# Patient Record
Sex: Male | Born: 1945 | Race: Black or African American | Hispanic: No | Smoking: Former smoker
Health system: Southern US, Community
[De-identification: ages and names within clinical notes are randomized; demographics above are authoritative.]

## PROBLEM LIST (undated history)

## (undated) DIAGNOSIS — D509 Iron deficiency anemia, unspecified: Secondary | ICD-10-CM

## (undated) DIAGNOSIS — N400 Enlarged prostate without lower urinary tract symptoms: Secondary | ICD-10-CM

## (undated) DIAGNOSIS — K219 Gastro-esophageal reflux disease without esophagitis: Secondary | ICD-10-CM

## (undated) DIAGNOSIS — I10 Essential (primary) hypertension: Secondary | ICD-10-CM

## (undated) HISTORY — DX: Gastro-esophageal reflux disease without esophagitis: K21.9

## (undated) HISTORY — PX: OTHER SURGICAL HISTORY: SHX169

## (undated) HISTORY — DX: Essential (primary) hypertension: I10

## (undated) HISTORY — DX: Iron deficiency anemia, unspecified: D50.9

## (undated) HISTORY — DX: Benign prostatic hyperplasia without lower urinary tract symptoms: N40.0

---

## 2012-11-05 ENCOUNTER — Ambulatory Visit (INDEPENDENT_AMBULATORY_CARE_PROVIDER_SITE_OTHER): Payer: Medicare HMO | Admitting: Nurse Practitioner

## 2012-11-05 ENCOUNTER — Encounter: Payer: Self-pay | Admitting: Nurse Practitioner

## 2012-11-05 VITALS — BP 190/100 | HR 89 | Temp 99.2°F | Resp 18 | Ht 67.52 in | Wt 220.4 lb

## 2012-11-05 DIAGNOSIS — R05 Cough: Secondary | ICD-10-CM

## 2012-11-05 DIAGNOSIS — R059 Cough, unspecified: Secondary | ICD-10-CM

## 2012-11-05 DIAGNOSIS — I10 Essential (primary) hypertension: Secondary | ICD-10-CM

## 2012-11-05 DIAGNOSIS — I160 Hypertensive urgency: Secondary | ICD-10-CM

## 2012-11-05 MED ORDER — NEBIVOLOL HCL 10 MG PO TABS: 10.0000 mg | ORAL_TABLET | Freq: Every day | ORAL | Status: DC

## 2012-11-05 MED ORDER — AMLODIPINE BESYLATE 10 MG PO TABS: 10.0000 mg | ORAL_TABLET | Freq: Every day | ORAL | Status: DC

## 2012-11-05 NOTE — Progress Notes (Signed)
Patient ID: Raymond Stephens, male   DOB: Jun 27, 1945, 67 y.o.   MRN: [id number].   No Known Allergies  Chief Complaint  Patient presents with  . Medical Managment of Chronic Issues    coughing and wheezing x6 mos, raspiness in voice ,    HPI: Patient is a 67 y.o. male seen in the office today to establish care.  Pt has not seen a medical provider in many years.  Reports he has a procedure on his prostate but is unsure what it was-- due to "urine not flowing like he was supposed to" Came to the office to be seen today due to cough, wheezing for 6 months.  Review of Systems:  Review of Systems  Constitutional: Negative for fever, chills, weight loss and malaise/fatigue.  HENT: Negative for ear pain, nosebleeds, congestion, sore throat, neck pain and tinnitus.   Eyes:       Wears reading glasses  Respiratory: Positive for cough, sputum production (thick, clear sputum at times) and wheezing. Negative for hemoptysis and shortness of breath.   Cardiovascular: Positive for palpitations. Negative for chest pain, claudication and leg swelling.  Gastrointestinal: Negative for heartburn, nausea, vomiting, abdominal pain, diarrhea and constipation.  Genitourinary: Negative for dysuria, urgency and frequency.  Musculoskeletal: Negative for myalgias and joint pain.  Neurological: Positive for dizziness (occasionally will be dizzy for areound 10 mins occurs abt 1 times a month). Negative for weakness and headaches.  Psychiatric/Behavioral: Negative for depression. The patient has insomnia (worked at night). The patient is not nervous/anxious.      History reviewed. No pertinent past medical history. History reviewed. No pertinent past surgical history. Social History:   reports that he quit smoking about 20 years ago. His smoking use included Cigarettes. He smoked 0.00 packs per day. He has quit using smokeless tobacco. His smokeless tobacco use included Chew. He reports that he does not drink alcohol  or use illicit drugs.  Family History  Problem Relation Age of Onset  . Heart disease Mother   . Cancer Father     Medications: Patient's Medications   No medications on file     Physical Exam:  Filed Vitals:   11/05/12 0922  BP: 190/100  Pulse: 89  Temp: 98.6 F (37.3 C)  Resp: 18  Height: 5' 7.52" (1.715 m)  Weight: 220 lb 6.4 oz (99.973 kg)  SpO2: 93%    Physical Exam  Constitutional: He is oriented to person, place, and time and well-developed, well-nourished, and in no distress. No distress.  HENT:  Head: Normocephalic and atraumatic.  Right Ear: External ear normal.  Eyes: Conjunctivae and EOM are normal. Pupils are equal, round, and reactive to light.  Neck: Normal range of motion. Neck supple. No JVD present. No thyromegaly present.  Cardiovascular: Normal rate, regular rhythm and normal heart sounds.   Pulmonary/Chest: Effort normal and breath sounds normal. No respiratory distress. He has no wheezes. He exhibits no tenderness.  Abdominal: Soft. Bowel sounds are normal.  Musculoskeletal: Normal range of motion. He exhibits no edema and no tenderness.  Lymphadenopathy:    He has no cervical adenopathy.  Neurological: He is alert and oriented to person, place, and time. No cranial nerve deficit. Gait normal.  Skin: Skin is warm and dry. He is not diaphoretic.  Psychiatric: Affect normal.    Assessment/Plan 1. Hypertensive urgency Pt given bystolic 10 mg in office; bps only improved slightly after 1 hr to 188/98- no clonidine available in office- pt asymptomatic; EKG done; will  have pt go directly to pharmacy to get amlodipine and take medication there. Will get blood work and follow up in 3 days   Discussed when to go to the ER pt agrees  - CBC With differential/Platelet - Comprehensive metabolic panel - amLODipine (NORVASC) 10 MG tablet; Take 1 tablet (10 mg total) by mouth daily.  Dispense: 90 tablet; Refill: 3  2. Cough May take mucinex DM 1 tablet q  12 hours for 1 week  Will get cxr   TIME PHYSICIAN WITH PATIENT: 60 mins Time TOTAL:  time greater than 50% of total time spent doing pt counseled and coordination of care regarding hypertension; giving medication reassessments

## 2012-11-05 NOTE — Patient Instructions (Addendum)
Take Bystolic daily Take Norvasc (this is at your pharmacy) daily  RTC on Monday for blood pressure check  May take mucinex DM with a full glass of water twice daily for cough and congestion    Hypertension As your heart beats, it forces blood through your arteries. This force is your blood pressure. If the pressure is too high, it is called hypertension (HTN) or high blood pressure. HTN is dangerous because you may have it and not know it. High blood pressure may mean that your heart has to work harder to pump blood. Your arteries may be narrow or stiff. The extra work puts you at risk for heart disease, stroke, and other problems.  Blood pressure consists of two numbers, a higher number over a lower, 110/72, for example. It is stated as "110 over 72." The ideal is below 120 for the top number (systolic) and under 80 for the bottom (diastolic). Write down your blood pressure today. You should pay close attention to your blood pressure if you have certain conditions such as:  Heart failure.  Prior heart attack.  Diabetes  Chronic kidney disease.  Prior stroke.  Multiple risk factors for heart disease. To see if you have HTN, your blood pressure should be measured while you are seated with your arm held at the level of the heart. It should be measured at least twice. A one-time elevated blood pressure reading (especially in the Emergency Department) does not mean that you need treatment. There may be conditions in which the blood pressure is different between your right and left arms. It is important to see your caregiver soon for a recheck. Most people have essential hypertension which means that there is not a specific cause. This type of high blood pressure may be lowered by changing lifestyle factors such as:  Stress.  Smoking.  Lack of exercise.  Excessive weight.  Drug/tobacco/alcohol use.  Eating less salt. Most people do not have symptoms from high blood pressure until it  has caused damage to the body. Effective treatment can often prevent, delay or reduce that damage. TREATMENT  When a cause has been identified, treatment for high blood pressure is directed at the cause. There are a large number of medications to treat HTN. These fall into several categories, and your caregiver will help you select the medicines that are best for you. Medications may have side effects. You should review side effects with your caregiver. If your blood pressure stays high after you have made lifestyle changes or started on medicines,   Your medication(s) may need to be changed.  Other problems may need to be addressed.  Be certain you understand your prescriptions, and know how and when to take your medicine.  Be sure to follow up with your caregiver within the time frame advised (usually within two weeks) to have your blood pressure rechecked and to review your medications.  If you are taking more than one medicine to lower your blood pressure, make sure you know how and at what times they should be taken. Taking two medicines at the same time can result in blood pressure that is too low. SEEK IMMEDIATE MEDICAL CARE IF:  You develop a severe headache, blurred or changing vision, or confusion.  You have unusual weakness or numbness, or a faint feeling.  You have severe chest or abdominal pain, vomiting, or breathing problems. MAKE SURE YOU:   Understand these instructions.  Will watch your condition.  Will get help right away if  you are not doing well or get worse. Document Released: 03/11/2005 Document Revised: 06/03/2011 Document Reviewed: 10/30/2007 Exit[employer] Patient Information 2014 [employer].  

## 2012-11-06 ENCOUNTER — Telehealth: Payer: Self-pay | Admitting: *Deleted

## 2012-11-06 ENCOUNTER — Other Ambulatory Visit: Payer: Self-pay | Admitting: Nurse Practitioner

## 2012-11-06 DIAGNOSIS — R05 Cough: Secondary | ICD-10-CM

## 2012-11-06 LAB — COMPREHENSIVE METABOLIC PANEL
Albumin: 4.3 g/dL (ref 3.6–4.8)
Alkaline Phosphatase: 144 IU/L — ABNORMAL HIGH (ref 39–117)
BUN: 8 mg/dL (ref 8–27)
CO2: 28 mmol/L (ref 18–29)
Calcium: 9.6 mg/dL (ref 8.6–10.2)
Chloride: 104 mmol/L (ref 97–108)
Creatinine, Ser: 1.01 mg/dL (ref 0.76–1.27)
Globulin, Total: 2.7 g/dL (ref 1.5–4.5)
Glucose: 90 mg/dL (ref 65–99)
Total Protein: 7 g/dL (ref 6.0–8.5)

## 2012-11-06 LAB — CBC WITH DIFFERENTIAL
Basophils Absolute: 0.1 10*3/uL (ref 0.0–0.2)
Eos: 6 % — ABNORMAL HIGH (ref 0–5)
HCT: 47.1 % (ref 37.5–51.0)
Immature Granulocytes: 0 % (ref 0–2)
Lymphs: 24 % (ref 14–46)
MCV: 75 fL — ABNORMAL LOW (ref 79–97)
Monocytes: 8 % (ref 4–12)
Platelets: 251 10*3/uL (ref 150–379)
RBC: 6.28 x10E6/uL — ABNORMAL HIGH (ref 4.14–5.80)
RDW: 17.6 % — ABNORMAL HIGH (ref 12.3–15.4)
WBC: 10.9 10*3/uL — ABNORMAL HIGH (ref 3.4–10.8)

## 2012-11-06 NOTE — Telephone Encounter (Signed)
Jessica sent labs to [facility] and she was out today. Shanda Bumps asked me to call the patient to go and get an chest x-ray at [facility] before his appointment on Monday due to his history of cough and high WBC's. Notified patient and he will go on Monday. Gave patient instructions and directions.

## 2012-11-09 ENCOUNTER — Ambulatory Visit (INDEPENDENT_AMBULATORY_CARE_PROVIDER_SITE_OTHER): Payer: 59 | Admitting: Nurse Practitioner

## 2012-11-09 ENCOUNTER — Encounter: Payer: Self-pay | Admitting: Nurse Practitioner

## 2012-11-09 ENCOUNTER — Ambulatory Visit
Admission: RE | Admit: 2012-11-09 | Discharge: 2012-11-09 | Disposition: A | Discharge status: Home / Self Care | Payer: Medicare HMO | Source: Ambulatory Visit | Attending: Nurse Practitioner | Admitting: Nurse Practitioner

## 2012-11-09 VITALS — BP 152/78 | HR 53 | Temp 98.9°F | Resp 18 | Ht 67.0 in | Wt 217.8 lb

## 2012-11-09 DIAGNOSIS — R05 Cough: Secondary | ICD-10-CM

## 2012-11-09 DIAGNOSIS — I10 Essential (primary) hypertension: Secondary | ICD-10-CM

## 2012-11-09 MED ORDER — HYDROCHLOROTHIAZIDE 25 MG PO TABS: 25.0000 mg | ORAL_TABLET | Freq: Every day | ORAL | Status: DC

## 2012-11-09 MED ORDER — LOSARTAN POTASSIUM 50 MG PO TABS: 50.0000 mg | ORAL_TABLET | Freq: Every day | ORAL | Status: DC

## 2012-11-09 NOTE — Progress Notes (Signed)
Patient ID: Raymond Stephens, male   DOB: 1945-11-08, 67 y.o.   MRN: [id number]   No Known Allergies  Chief Complaint  Patient presents with  . Follow-up    HPI: Patient is a 67 y.o. male seen in the office today for follow up blood pressure. Was seen last week with blood pressure in the 190s without coming down despite medicaton given in office (was given bystolic due to no clonidine available). Rx for norvasc added and he is back today for recheck. No chest pain, headache, changes in visions, no neurological deficits, or shortness of breath noted.  Mucinex DM has been helping cough and congestion.   Review of Systems:  Review of Systems  Constitutional: Negative for fever, chills and malaise/fatigue.  Eyes: Negative.   Cardiovascular: Negative for chest pain and palpitations.  Gastrointestinal: Negative for heartburn, diarrhea and constipation.  Neurological: Negative for dizziness, tingling, weakness and headaches.     No past medical history on file. No past surgical history on file. Social History:   reports that he quit smoking about 20 years ago. His smoking use included Cigarettes. He smoked 0.00 packs per day. He has quit using smokeless tobacco. His smokeless tobacco use included Chew. He reports that he does not drink alcohol or use illicit drugs.  Family History  Problem Relation Age of Onset  . Heart disease Mother   . Cancer Father     Medications: Patient's Medications  New Prescriptions   No medications on file  Previous Medications   AMLODIPINE (NORVASC) 10 MG TABLET    Take 1 tablet (10 mg total) by mouth daily.   NEBIVOLOL (BYSTOLIC) 10 MG TABLET    Take 1 tablet (10 mg total) by mouth daily.  Modified Medications   No medications on file  Discontinued Medications   No medications on file     Physical Exam:  Filed Vitals:   11/09/12 1420  BP: 152/78  Pulse: 53  Temp: 98.9 F (37.2 C)  TempSrc: Oral  Resp: 18  Height: 5\' 7"  (1.702 m)  Weight:  217 lb 12.8 oz (98.793 kg)  SpO2: 95%   Physical Exam  Constitutional: He is oriented to person, place, and time. No distress.  HENT:  Head: Normocephalic and atraumatic.  Eyes: Conjunctivae and EOM are normal. Pupils are equal, round, and reactive to light.  Neck: Normal range of motion. Neck supple. No thyromegaly present.  Cardiovascular: Normal rate, regular rhythm and normal heart sounds.   Pulmonary/Chest: Effort normal and breath sounds normal. No respiratory distress. He has no wheezes.  Abdominal: Soft. Bowel sounds are normal. He exhibits no distension.  Neurological: He is alert and oriented to person, place, and time. No cranial nerve deficit. Gait normal.  Skin: Skin is warm and dry. He is not diaphoretic.  Psychiatric: Affect normal.     Labs reviewed: Basic Metabolic Panel:  Recent Labs  13/08/65 1211  NA 145*  K 4.6  CL 104  CO2 28  GLUCOSE 90  BUN 8  CREATININE 1.01  CALCIUM 9.6   Liver Function Tests:  Recent Labs  11/05/12 1211  AST 21  ALT 23  ALKPHOS 144*  BILITOT 0.2  PROT 7.0   No results found for this basename: LIPASE, AMYLASE,  in the last 8760 hours No results found for this basename: AMMONIA,  in the last 8760 hours CBC:  Recent Labs  11/05/12 1211  WBC 10.9*  NEUTROABS 6.8  HGB 14.3  HCT 47.1  MCV 75*  PLT 251    Assessment/Plan 1. Essential hypertension, benign Improved- will stop bystolic due to low HR- labs back and reviewed with pt; will change  Medication to losartan and HCTZ - losartan (COZAAR) 50 MG tablet; Take 1 tablet (50 mg total) by mouth daily.  Dispense: 30 tablet; Refill: 1 - hydrochlorothiazide (HYDRODIURIL) 25 MG tablet; Take 1 tablet (25 mg total) by mouth daily.  Dispense: 30 tablet; Refill: 1 Will get  Lipid panel and  Comprehensive metabolic panel before next visit  To follow up in 1 month for EV w

## 2012-11-09 NOTE — Patient Instructions (Addendum)
To follow up in 1 month for an EV with [facility]---will get fasting blood work before visit.   Cardiac Diet This diet can help prevent heart disease and stroke. Many factors influence your heart health, including eating and exercise habits. Coronary risk rises a lot with abnormal blood fat (lipid) levels. Cardiac meal planning includes limiting unhealthy fats, increasing healthy fats, and making other small dietary changes. General guidelines are as follows:  Adjust calorie intake to reach and maintain desirable body weight.  Limit total fat intake to less than 30% of total calories. Saturated fat should be less than 7% of calories.  Saturated fats are found in animal products and in some vegetable products. Saturated vegetable fats are found in coconut oil, cocoa butter, palm oil, and palm kernel oil. Read labels carefully to avoid these products as much as possible. Use butter in moderation. Choose tub margarines and oils that have 2 grams of fat or less. Good cooking oils are canola and olive oils.  Practice low-fat cooking techniques. Do not fry food. Instead, broil, bake, boil, steam, grill, roast on a rack, stir-fry, or microwave it. Other fat reducing suggestions include:  Remove the skin from poultry.  Remove all visible fat from meats.  Skim the fat off stews, soups, and gravies before serving them.  Steam vegetables in water or broth instead of sauting them in fat.  Avoid foods with trans fat (or hydrogenated oils), such as commercially fried foods and commercially baked goods. Commercial shortening and deep-frying fats will contain trans fat.  Increase intake of fruits, vegetables, whole grains, and legumes to replace foods high in fat.  Increase consumption of nuts, legumes, and seeds to at least 4 servings weekly. One serving of a legume equals  cup, and 1 serving of nuts or seeds equals  cup.  Choose whole grains more often. Have 3 servings per day (a serving is 1 ounce  [oz]).  Eat 4 to 5 servings of vegetables per day. A serving of vegetables is 1 cup of raw leafy vegetables;  cup of raw or cooked cut-up vegetables;  cup of vegetable juice.  Eat 4 to 5 servings of fruit per day. A serving of fruit is 1 medium whole fruit;  cup of dried fruit;  cup of fresh, frozen, or canned fruit;  cup of 100% fruit juice.  Increase your intake of dietary fiber to 20 to 30 grams per day. Insoluble fiber may help lower your risk of heart disease and may help curb your appetite.  Soluble fiber binds cholesterol to be removed from the blood. Foods high in soluble fiber are dried beans, citrus fruits, oats, apples, bananas, broccoli, Brussels sprouts, and eggplant.  Try to include foods fortified with plant sterols or stanols, such as yogurt, breads, juices, or margarines. Choose several fortified foods to achieve a daily intake of 2 to 3 grams of plant sterols or stanols.  Foods with omega-3 fats can help reduce your risk of heart disease. Aim to have a 3.5 oz portion of fatty fish twice per week, such as salmon, mackerel, albacore tuna, sardines, lake trout, or herring. If you wish to take a fish oil supplement, choose one that contains 1 gram of both DHA and EPA.  Limit processed meats to 2 servings (3 oz portion) weekly.  Limit the sodium in your diet to 1500 milligrams (mg) per day. If you have high blood pressure, talk to a registered dietitian about a DASH (Dietary Approaches to Stop Hypertension) eating plan.  Limit sweets and beverages with added sugar, such as soda, to no more than 5 servings per week. One serving is:   1 tablespoon sugar.  1 tablespoon jelly or jam.   cup sorbet.  1 cup lemonade.   cup regular soda. CHOOSING FOODS Starches  Allowed: Breads: All kinds (wheat, rye, raisin, white, oatmeal, [place], [place], and English muffin bread). Low-fat rolls: English muffins, frankfurter and hamburger buns, bagels, pita bread, tortillas (not fried).  Pancakes, waffles, biscuits, and muffins made with recommended oil.  Avoid: Products made with saturated or trans fats, oils, or whole milk products. Butter rolls, cheese breads, croissants. Commercial doughnuts, muffins, sweet rolls, biscuits, waffles, pancakes, store-bought mixes. Crackers  Allowed: Low-fat crackers and snacks: Animal, graham, rye, saltine (with recommended oil, no lard), oyster, and matzo crackers. Bread sticks, melba toast, rusks, flatbread, pretzels, and light popcorn.  Avoid: High-fat crackers: cheese crackers, butter crackers, and those made with coconut, palm oil, or trans fat (hydrogenated oils). Buttered popcorn. Cereals  Allowed: Hot or cold whole-grain cereals.  Avoid: Cereals containing coconut, hydrogenated vegetable fat, or animal fat. Potatoes / Pasta / Rice  Allowed: All kinds of potatoes, rice, and pasta (such as macaroni, spaghetti, and noodles).  Avoid: Pasta or rice prepared with cream sauce or high-fat cheese. Chow mein noodles, [place] fries. Vegetables  Allowed: All vegetables and vegetable juices.  Avoid: Fried vegetables. Vegetables in cream, butter, or high-fat cheese sauces. Limit coconut. Fruit in cream or custard. Protein  Allowed: Limit your intake of meat, seafood, and poultry to no more than 6 oz (cooked weight) per day. All lean, well-trimmed beef, veal, pork, and lamb. All chicken and [place] without skin. All fish and shellfish. Wild game: wild duck, rabbit, pheasant, and venison. Egg whites or low-cholesterol egg substitutes may be used as desired. Meatless dishes: recipes with dried beans, peas, lentils, and tofu (soybean curd). Seeds and nuts: all seeds and most nuts.  Avoid: Prime grade and other heavily marbled and fatty meats, such as short ribs, spare ribs, rib eye roast or steak, frankfurters, sausage, bacon, and high-fat luncheon meats, mutton. Caviar. Commercially fried fish. Domestic duck, goose, venison sausage. Organ meats:  liver, gizzard, heart, chitterlings, brains, kidney, sweetbreads. Dairy  Allowed: Low-fat cheeses: nonfat or low-fat cottage cheese (1% or 2% fat), cheeses made with part skim milk, such as mozzarella, farmers, string, or ricotta. (Cheeses should be labeled no more than 2 to 6 grams fat per oz.). Skim (or 1%) milk: liquid, powdered, or evaporated. Buttermilk made with low-fat milk. Drinks made with skim or low-fat milk or cocoa. Chocolate milk or cocoa made with skim or low-fat (1%) milk. Nonfat or low-fat yogurt.  Avoid: Whole milk cheeses, including colby, cheddar, mu[place]ster, Monterey Jack, Dawson, Zena, Semmes, American, Swiss, and blue. Creamed cottage cheese, cream cheese. Whole milk and whole milk products, including buttermilk or yogurt made from whole milk, drinks made from whole milk. Condensed milk, evaporated whole milk, and 2% milk. Soups and Combination Foods  Allowed: Low-fat low-sodium soups: broth, dehydrated soups, homemade broth, soups with the fat removed, homemade cream soups made with skim or low-fat milk. Low-fat spaghetti, lasagna, chili, and Spanish rice if low-fat ingredients and low-fat cooking techniques are used.  Avoid: Cream soups made with whole milk, cream, or high-fat cheese. All other soups. Desserts and Sweets  Allowed: Sherbet, fruit ices, gelatins, meringues, and angel food cake. Homemade desserts with recommended fats, oils, and milk products. Jam, jelly, honey, marmalade, sugars, and syrups. Pure sugar candy, such  as gum drops, hard candy, jelly beans, marshmallows, mints, and small amounts of dark chocolate.  Avoid: Commercially prepared cakes, pies, cookies, frosting, pudding, or mixes for these products. Desserts containing whole milk products, chocolate, coconut, lard, palm oil, or palm kernel oil. Ice cream or ice cream drinks. Candy that contains chocolate, coconut, butter, hydrogenated fat, or unknown ingredients. Buttered syrups. Fats and  Oils  Allowed: Vegetable oils: safflower, sunflower, corn, soybean, cottonseed, sesame, canola, olive, or peanut. Non-hydrogenated margarines. Salad dressing or mayonnaise: homemade or commercial, made with a recommended oil. Low or nonfat salad dressing or mayonnaise.  Limit added fats and oils to 6 to 8 tsp per day (includes fats used in cooking, baking, salads, and spreads on bread). Remember to count the "hidden fats" in foods.  Avoid: Solid fats and shortenings: butter, lard, salt pork, bacon drippings. Gravy containing meat fat, shortening, or suet. Cocoa butter, coconut. Coconut oil, palm oil, palm kernel oil, or hydrogenated oils: these ingredients are often used in bakery products, nondairy creamers, whipped toppings, candy, and commercially fried foods. Read labels carefully. Salad dressings made of unknown oils, sour cream, or cheese, such as blue cheese and Roquefort. Cream, all kinds: half-and-half, light, heavy, or whipping. Sour cream or cream cheese (even if "light" or low-fat). Nondairy cream substitutes: coffee creamers and sour cream substitutes made with palm, palm kernel, hydrogenated oils, or coconut oil. Beverages  Allowed: Coffee (regular or decaffeinated), tea. Diet carbonated beverages, mineral water. Alcohol: Check with your caregiver. Moderation is recommended.  Avoid: Whole milk, regular sodas, and juice drinks with added sugar. Condiments  Allowed: All seasonings and condiments. Cocoa powder. "Cream" sauces made with recommended ingredients.  Avoid: Carob powder made with hydrogenated fats. SAMPLE MENU Breakfast   cup orange juice   cup oatmeal  1 slice toast  1 tsp margarine  1 cup skim milk Lunch  [place] sandwich with 2 oz [place], 2 slices bread  Lettuce and tomato slices  Fresh fruit  Carrot sticks  Coffee or tea Snack  Fresh fruit or low-fat crackers Dinner  3 oz lean ground beef  1 baked potato  1 tsp margarine   cup  asparagus  Lettuce salad  1 tbs non-creamy dressing   cup peach slices  1 cup skim milk Document Released: 12/19/2007 Document Revised: 09/10/2011 Document Reviewed: 06/04/2011 Exit[employer] Patient Information 2014 [place], [place].

## 2012-11-26 ENCOUNTER — Encounter: Payer: Self-pay | Admitting: Nurse Practitioner

## 2012-12-15 ENCOUNTER — Other Ambulatory Visit: Payer: Medicare HMO

## 2012-12-15 DIAGNOSIS — I10 Essential (primary) hypertension: Secondary | ICD-10-CM

## 2012-12-16 LAB — COMPREHENSIVE METABOLIC PANEL
ALT: 27 IU/L (ref 0–44)
CO2: 28 mmol/L (ref 18–29)
Calcium: 9.6 mg/dL (ref 8.6–10.2)
Chloride: 102 mmol/L (ref 97–108)
GFR calc non Af Amer: 66 mL/min/{1.73_m2} (ref >59)
Glucose: 90 mg/dL (ref 65–99)
Potassium: 4 mmol/L (ref 3.5–5.2)
Total Protein: 6.7 g/dL (ref 6.0–8.5)

## 2012-12-16 LAB — LIPID PANEL
Chol/HDL Ratio: 5.3 ratio units — ABNORMAL HIGH (ref 0.0–5.0)
HDL: 32 mg/dL — ABNORMAL LOW (ref >39)

## 2012-12-17 ENCOUNTER — Encounter: Payer: Self-pay | Admitting: Nurse Practitioner

## 2012-12-17 ENCOUNTER — Ambulatory Visit (INDEPENDENT_AMBULATORY_CARE_PROVIDER_SITE_OTHER): Payer: Medicare HMO | Admitting: Nurse Practitioner

## 2012-12-17 VITALS — BP 150/88 | HR 84 | Temp 98.3°F | Resp 18 | Ht 67.0 in | Wt 213.8 lb

## 2012-12-17 DIAGNOSIS — R05 Cough: Secondary | ICD-10-CM

## 2012-12-17 DIAGNOSIS — I10 Essential (primary) hypertension: Secondary | ICD-10-CM

## 2012-12-17 DIAGNOSIS — N400 Enlarged prostate without lower urinary tract symptoms: Secondary | ICD-10-CM

## 2012-12-17 DIAGNOSIS — Z23 Encounter for immunization: Secondary | ICD-10-CM

## 2012-12-17 DIAGNOSIS — R059 Cough, unspecified: Secondary | ICD-10-CM

## 2012-12-17 DIAGNOSIS — Z139 Encounter for screening, unspecified: Secondary | ICD-10-CM

## 2012-12-17 DIAGNOSIS — E785 Hyperlipidemia, unspecified: Secondary | ICD-10-CM

## 2012-12-17 DIAGNOSIS — I160 Hypertensive urgency: Secondary | ICD-10-CM

## 2012-12-17 MED ORDER — HYDROCHLOROTHIAZIDE 25 MG PO TABS: 25.0000 mg | ORAL_TABLET | Freq: Every day | ORAL | Status: DC

## 2012-12-17 MED ORDER — OMEPRAZOLE 20 MG PO CPDR: 20.0000 mg | DELAYED_RELEASE_CAPSULE | Freq: Every day | ORAL | Status: DC

## 2012-12-17 MED ORDER — LOSARTAN POTASSIUM 50 MG PO TABS: 50.0000 mg | ORAL_TABLET | Freq: Every day | ORAL | Status: DC

## 2012-12-17 MED ORDER — AMLODIPINE BESYLATE 10 MG PO TABS: 10.0000 mg | ORAL_TABLET | Freq: Every day | ORAL | Status: DC

## 2012-12-17 NOTE — Patient Instructions (Addendum)
To cont amlodipine and losartan; STOP hydrochlorothiazide-- we will see if this is causing fatigue  Start omeprazole daily to see if this helps with cough  To FOLLOW Up in 2 week to evaluate bp and cough  Will send you to the urologist to establish care due to BPH Will get colonoscopy referral    Will get screening US for abdominal aortic aneurysm   Need to go to the eye doctor: [facility]  Address: [address] [place], [place], [place] [address]  [phone]   Cholesterol is elevated; will see if you can bring it down with diet and exercise  30 mins of cardiovascular activity 5 days a week (do what you can tolerate) Eat a Heart healthy low sodium   Cardiac Diet This diet can help prevent heart disease and stroke. Many factors influence your heart health, including eating and exercise habits. Coronary risk rises a lot with abnormal blood fat (lipid) levels. Cardiac meal planning includes limiting unhealthy fats, increasing healthy fats, and making other small dietary changes. General guidelines are as follows:  Adjust calorie intake to reach and maintain desirable body weight.  Limit total fat intake to less than 30% of total calories. Saturated fat should be less than 7% of calories.  Saturated fats are found in animal products and in some vegetable products. Saturated vegetable fats are found in coconut oil, cocoa butter, palm oil, and palm kernel oil. Read labels carefully to avoid these products as much as possible. Use butter in moderation. Choose tub margarines and oils that have 2 grams of fat or less. Good cooking oils are canola and olive oils.  Practice low-fat cooking techniques. Do not fry food. Instead, broil, bake, boil, steam, grill, roast on a rack, stir-fry, or microwave it. Other fat reducing suggestions include:  Remove the skin from poultry.  Remove all visible fat from meats.  Skim the fat off stews, soups, and gravies before serving them.  Steam  vegetables in water or broth instead of sauting them in fat.  Avoid foods with trans fat (or hydrogenated oils), such as commercially fried foods and commercially baked goods. Commercial shortening and deep-frying fats will contain trans fat.  Increase intake of fruits, vegetables, whole grains, and legumes to replace foods high in fat.  Increase consumption of nuts, legumes, and seeds to at least 4 servings weekly. One serving of a legume equals  cup, and 1 serving of nuts or seeds equals  cup.  Choose whole grains more often. Have 3 servings per day (a serving is 1 ounce [oz]).  Eat 4 to 5 servings of vegetables per day. A serving of vegetables is 1 cup of raw leafy vegetables;  cup of raw or cooked cut-up vegetables;  cup of vegetable juice.  Eat 4 to 5 servings of fruit per day. A serving of fruit is 1 medium whole fruit;  cup of dried fruit;  cup of fresh, frozen, or canned fruit;  cup of 100% fruit juice.  Increase your intake of dietary fiber to 20 to 30 grams per day. Insoluble fiber may help lower your risk of heart disease and may help curb your appetite.  Soluble fiber binds cholesterol to be removed from the blood. Foods high in soluble fiber are dried beans, citrus fruits, oats, apples, bananas, broccoli, Brussels sprouts, and eggplant.  Try to include foods fortified with plant sterols or stanols, such as yogurt, breads, juices, or margarines. Choose several fortified foods to achieve a daily intake of 2  to 3 grams of plant sterols or stanols.  Foods with omega-3 fats can help reduce your risk of heart disease. Aim to have a 3.5 oz portion of fatty fish twice per week, such as salmon, mackerel, albacore tuna, sardines, lake trout, or herring. If you wish to take a fish oil supplement, choose one that contains 1 gram of both DHA and EPA.  Limit processed meats to 2 servings (3 oz portion) weekly.  Limit the sodium in your diet to 1500 milligrams (mg) per day. If you have  high blood pressure, talk to a registered dietitian about a DASH (Dietary Approaches to Stop Hypertension) eating plan.  Limit sweets and beverages with added sugar, such as soda, to no more than 5 servings per week. One serving is:   1 tablespoon sugar.  1 tablespoon jelly or jam.   cup sorbet.  1 cup lemonade.   cup regular soda. CHOOSING FOODS Starches  Allowed: Breads: All kinds (wheat, rye, raisin, white, oatmeal, [place], [place], and English muffin bread). Low-fat rolls: English muffins, frankfurter and hamburger buns, bagels, pita bread, tortillas (not fried). Pancakes, waffles, biscuits, and muffins made with recommended oil.  Avoid: Products made with saturated or trans fats, oils, or whole milk products. Butter rolls, cheese breads, croissants. Commercial doughnuts, muffins, sweet rolls, biscuits, waffles, pancakes, store-bought mixes. Crackers  Allowed: Low-fat crackers and snacks: Animal, graham, rye, saltine (with recommended oil, no lard), oyster, and matzo crackers. Bread sticks, melba toast, rusks, flatbread, pretzels, and light popcorn.  Avoid: High-fat crackers: cheese crackers, butter crackers, and those made with coconut, palm oil, or trans fat (hydrogenated oils). Buttered popcorn. Cereals  Allowed: Hot or cold whole-grain cereals.  Avoid: Cereals containing coconut, hydrogenated vegetable fat, or animal fat. Potatoes / Pasta / Rice  Allowed: All kinds of potatoes, rice, and pasta (such as macaroni, spaghetti, and noodles).  Avoid: Pasta or rice prepared with cream sauce or high-fat cheese. Chow mein noodles, [place] fries. Vegetables  Allowed: All vegetables and vegetable juices.  Avoid: Fried vegetables. Vegetables in cream, butter, or high-fat cheese sauces. Limit coconut. Fruit in cream or custard. Protein  Allowed: Limit your intake of meat, seafood, and poultry to no more than 6 oz (cooked weight) per day. All lean, well-trimmed beef, veal, pork,  and lamb. All chicken and [place] without skin. All fish and shellfish. Wild game: wild duck, rabbit, pheasant, and venison. Egg whites or low-cholesterol egg substitutes may be used as desired. Meatless dishes: recipes with dried beans, peas, lentils, and tofu (soybean curd). Seeds and nuts: all seeds and most nuts.  Avoid: Prime grade and other heavily marbled and fatty meats, such as short ribs, spare ribs, rib eye roast or steak, frankfurters, sausage, bacon, and high-fat luncheon meats, mutton. Caviar. Commercially fried fish. Domestic duck, goose, venison sausage. Organ meats: liver, gizzard, heart, chitterlings, brains, kidney, sweetbreads. Dairy  Allowed: Low-fat cheeses: nonfat or low-fat cottage cheese (1% or 2% fat), cheeses made with part skim milk, such as mozzarella, farmers, string, or ricotta. (Cheeses should be labeled no more than 2 to 6 grams fat per oz.). Skim (or 1%) milk: liquid, powdered, or evaporated. Buttermilk made with low-fat milk. Drinks made with skim or low-fat milk or cocoa. Chocolate milk or cocoa made with skim or low-fat (1%) milk. Nonfat or low-fat yogurt.  Avoid: Whole milk cheeses, including colby, cheddar, [place], [address], [place], [place], [place], [address], [place], and blue. Creamed cottage cheese, cream cheese. Whole milk and whole milk products, including buttermilk or  yogurt made from whole milk, drinks made from whole milk. Condensed milk, evaporated whole milk, and 2% milk. Soups and Combination Foods  Allowed: Low-fat low-sodium soups: broth, dehydrated soups, homemade broth, soups with the fat removed, homemade cream soups made with skim or low-fat milk. Low-fat spaghetti, lasagna, chili, and Spanish rice if low-fat ingredients and low-fat cooking techniques are used.  Avoid: Cream soups made with whole milk, cream, or high-fat cheese. All other soups. Desserts and Sweets  Allowed: Sherbet, fruit ices, gelatins, meringues, and angel food cake.  Homemade desserts with recommended fats, oils, and milk products. Jam, jelly, honey, marmalade, sugars, and syrups. Pure sugar candy, such as gum drops, hard candy, jelly beans, marshmallows, mints, and small amounts of dark chocolate.  Avoid: Commercially prepared cakes, pies, cookies, frosting, pudding, or mixes for these products. Desserts containing whole milk products, chocolate, coconut, lard, palm oil, or palm kernel oil. Ice cream or ice cream drinks. Candy that contains chocolate, coconut, butter, hydrogenated fat, or unknown ingredients. Buttered syrups. Fats and Oils  Allowed: Vegetable oils: safflower, sunflower, corn, soybean, cottonseed, sesame, canola, olive, or peanut. Non-hydrogenated margarines. Salad dressing or mayonnaise: homemade or commercial, made with a recommended oil. Low or nonfat salad dressing or mayonnaise.  Limit added fats and oils to 6 to 8 tsp per day (includes fats used in cooking, baking, salads, and spreads on bread). Remember to count the "hidden fats" in foods.  Avoid: Solid fats and shortenings: butter, lard, salt pork, bacon drippings. Gravy containing meat fat, shortening, or suet. Cocoa butter, coconut. Coconut oil, palm oil, palm kernel oil, or hydrogenated oils: these ingredients are often used in bakery products, nondairy creamers, whipped toppings, candy, and commercially fried foods. Read labels carefully. Salad dressings made of unknown oils, sour cream, or cheese, such as blue cheese and Roquef[place]. Cream, all kinds: half-and-half, light, heavy, or whipping. Sour cream or cream cheese (even if "light" or low-fat). Nondairy cream substitutes: coffee creamers and sour cream substitutes made with palm, palm kernel, hydrogenated oils, or coconut oil. Beverages  Allowed: Coffee (regular or decaffeinated), tea. Diet carbonated beverages, mineral water. Alcohol: Check with your caregiver. Moderation is recommended.  Avoid: Whole milk, regular sodas, and juice  drinks with added sugar. Condiments  Allowed: All seasonings and condiments. Cocoa powder. "Cream" sauces made with recommended ingredients.  Avoid: Carob powder made with hydrogenated fats. SAMPLE MENU Breakfast   cup orange juice   cup oatmeal  1 slice toast  1 tsp margarine  1 cup skim milk Lunch  [place] sandwich with 2 oz [place], 2 slices bread  Lettuce and tomato slices  Fresh fruit  Carrot sticks  Coffee or tea Snack  Fresh fruit or low-fat crackers Dinner  3 oz lean ground beef  1 baked potato  1 tsp margarine   cup asparagus  Lettuce salad  1 tbs non-creamy dressing   cup peach slices  1 cup skim milk Document Released: 12/19/2007 Document Revised: 09/10/2011 Document Reviewed: 06/04/2011 [employer] Patient Information 2014 [place], [place].

## 2012-12-17 NOTE — Progress Notes (Signed)
Patient ID: Raymond Stephens, male   DOB: 07/05/1945, 67 y.o.   MRN: [id number]   No Known Allergies  Chief Complaint  Patient presents with  . Annual Exam    medication refills    HPI: Patient is a 67 y.o. male seen in the office today for extended visit. Pt established care last month; pt previously has not had any routine medical care; pt had hypertension that was followed up on and improved with medication; however he has not refilled losartan and HCTZ and has not refilled this. Took norvasc today. Reports medication has made him feel very sluggish; has been better last 3 days since he ran out of losartan and hctz  Pt reports history of BPH is not currently on any medication for this but has frequent voiding episodes; was seen by urologist in [place] before he moved and would like to establish care with urologist here in [place] for ongoing evaluation.   Has ongoing cough; hoariness and clearing throat as been taking mucinex with good effect.  No fevers or chills, denies shortness of breath Review of Systems:  Review of Systems  Constitutional: Negative for fever, chills, weight loss and malaise/fatigue.  HENT: Negative for hearing loss, ear pain, nosebleeds, congestion, sore throat and tinnitus.   Eyes:       Has not been to the eye doctor in over 2 years; no noted changes in vision but wears glasses   Respiratory: Positive for cough. Negative for sputum production, shortness of breath and wheezing.   Cardiovascular: Negative for chest pain, palpitations and leg swelling.  Gastrointestinal: Negative for heartburn, abdominal pain, diarrhea, constipation, blood in stool and melena.  Genitourinary: Positive for frequency. Negative for dysuria and urgency.  Musculoskeletal: Negative for myalgias, joint pain and falls.  Skin: Negative.   Neurological: Negative for dizziness, tingling, sensory change, weakness and headaches.  Psychiatric/Behavioral: Negative for depression. The patient is not  nervous/anxious and does not have insomnia.      Past Medical History  Diagnosis Date  . Hypertension    History reviewed. No pertinent past surgical history. Social History:   reports that he quit smoking about 20 years ago. His smoking use included Cigarettes. He smoked 0.00 packs per day. He has quit using smokeless tobacco. His smokeless tobacco use included Chew. He reports that he does not drink alcohol or use illicit drugs.  Family History  Problem Relation Age of Onset  . Heart disease Mother   . Cancer Father     Medications: Patient's Medications  New Prescriptions   OMEPRAZOLE (PRILOSEC) 20 MG CAPSULE    Take 1 capsule (20 mg total) by mouth daily.  Previous Medications   DEXTROMETHORPHAN-GUAIFENESIN (MUCINEX DM) 30-600 MG PER 12 HR TABLET    Take 1 tablet by mouth every 12 (twelve) hours.  Modified Medications   Modified Medication Previous Medication   AMLODIPINE (NORVASC) 10 MG TABLET amLODipine (NORVASC) 10 MG tablet      Take 1 tablet (10 mg total) by mouth daily.    Take 1 tablet (10 mg total) by mouth daily.   HYDROCHLOROTHIAZIDE (HYDRODIURIL) 25 MG TABLET hydrochlorothiazide (HYDRODIURIL) 25 MG tablet      Take 1 tablet (25 mg total) by mouth daily.    Take 1 tablet (25 mg total) by mouth daily.   LOSARTAN (COZAAR) 50 MG TABLET losartan (COZAAR) 50 MG tablet      Take 1 tablet (50 mg total) by mouth daily.    Take 1 tablet (50 mg total)  by mouth daily.  Discontinued Medications   No medications on file     Physical Exam:  Filed Vitals:   12/17/12 1133  BP: 150/88  Pulse: 84  Temp: 98.3 F (36.8 C)  TempSrc: Oral  Resp: 18  Height: 5\' 7"  (1.702 m)  Weight: 213 lb 12.8 oz (96.979 kg)  SpO2: 93%   Physical Exam  Constitutional: He is oriented to person, place, and time and well-developed, well-nourished, and in no distress. No distress.  HENT:  Head: Normocephalic and atraumatic.  Right Ear: External ear normal.  Left Ear: External ear normal.   Nose: Nose normal.  Mouth/Throat: Oropharynx is clear and moist. No oropharyngeal exudate.  Eyes: Conjunctivae and EOM are normal. Pupils are equal, round, and reactive to light.  Neck: Normal range of motion. Neck supple. No thyromegaly present.  Cardiovascular: Normal rate, regular rhythm and normal heart sounds.   Pulmonary/Chest: Effort normal and breath sounds normal. No respiratory distress. He has no wheezes.  Abdominal: Soft. Bowel sounds are normal. He exhibits no distension. There is no tenderness.  Genitourinary:  Declines rectal exam but would like to see urologist regarding prostate  Musculoskeletal: Normal range of motion. He exhibits no edema and no tenderness.  Lymphadenopathy:    He has no cervical adenopathy.  Neurological: He is alert and oriented to person, place, and time. Gait normal.  Skin: Skin is warm and dry. He is not diaphoretic. No erythema.  Psychiatric: Affect normal.     Labs reviewed: Basic Metabolic Panel:  Recent Labs  16/10/96 1211 12/15/12 0831  NA 145* 144  K 4.6 4.0  CL 104 102  CO2 28 28  GLUCOSE 90 90  BUN 8 12  CREATININE 1.01 1.14  CALCIUM 9.6 9.6   Liver Function Tests:  Recent Labs  11/05/12 1211 12/15/12 0831  AST 21 19  ALT 23 27  ALKPHOS 144* 137*  BILITOT 0.2 0.3  PROT 7.0 6.7   No results found for this basename: LIPASE, AMYLASE,  in the last 8760 hours No results found for this basename: AMMONIA,  in the last 8760 hours CBC:  Recent Labs  11/05/12 1211  WBC 10.9*  NEUTROABS 6.8  HGB 14.3  HCT 47.1  MCV 75*  PLT 251   Lipid Panel:  Recent Labs  12/15/12 0831  HDL 32*  LDLCALC 106*  TRIG 160*  CHOLHDL 5.3*      Assessment/Plan    Essential hypertension, benign Will stop HCTZ at this time and cont cozaar and norvasc - losartan (COZAAR) 50 MG tablet; Take 1 tablet (50 mg total) by mouth daily.  Dispense: 30 tablet; Refill: 3 - amLODipine (NORVASC) 10 MG tablet; Take 1 tablet (10 mg total)  by mouth daily.  Dispense: 90 tablet; Refill: 3  Cough Will rule out cause being due to GERD  - omeprazole (PRILOSEC) 20 MG capsule; Take 1 capsule (20 mg total) by mouth daily.  Dispense: 30 capsule; Refill: 3  Screening - Ambulatory referral to Gastroenterology - [place] Screening AAA; Future - recs to see eye doctor  BPH (benign prostatic hyperplasia) - Ambulatory referral to Urology   Need for prophylactic vaccination and inoculation against influenza Flu vaccine given   Need for prophylactic vaccination against Streptococcus pneumoniae (pneumococcus) - Pneumococcal polysaccharide vaccine 23-valent greater than or equal to 2yo subcutaneous/IM  Hyperlipidemia - pt willing to try lifestyle modifications at this time; given information on diet and exercise to help lower cholesterol; will follow up cholesterol and if pt  unable to get to goal will need to start medication. Pt aware   PREVENTIVE COUNSELING:  The patient was counseled regarding the appropriate use of alcohol,  diet, regular sustained exercise for at least 30 minutes 3-4 times per week, testicular self-examination on a monthly basis,and recommended schedule for GI hemoccult testing, colonoscopy, cholesterol, and diabetes screening.

## 2012-12-18 ENCOUNTER — Encounter: Payer: Self-pay | Admitting: Internal Medicine

## 2012-12-23 ENCOUNTER — Ambulatory Visit
Admission: RE | Admit: 2012-12-23 | Discharge: 2012-12-23 | Disposition: A | Discharge status: Home / Self Care | Payer: Medicare HMO | Source: Ambulatory Visit | Attending: Nurse Practitioner | Admitting: Nurse Practitioner

## 2012-12-23 DIAGNOSIS — Z139 Encounter for screening, unspecified: Secondary | ICD-10-CM

## 2012-12-31 ENCOUNTER — Ambulatory Visit (INDEPENDENT_AMBULATORY_CARE_PROVIDER_SITE_OTHER): Payer: Self-pay | Admitting: Nurse Practitioner

## 2012-12-31 ENCOUNTER — Encounter: Payer: Self-pay | Admitting: Nurse Practitioner

## 2012-12-31 VITALS — BP 132/78 | HR 80 | Temp 98.1°F | Resp 16 | Wt 212.8 lb

## 2012-12-31 DIAGNOSIS — E785 Hyperlipidemia, unspecified: Secondary | ICD-10-CM

## 2012-12-31 DIAGNOSIS — R05 Cough: Secondary | ICD-10-CM

## 2012-12-31 DIAGNOSIS — R059 Cough, unspecified: Secondary | ICD-10-CM

## 2012-12-31 DIAGNOSIS — I10 Essential (primary) hypertension: Secondary | ICD-10-CM

## 2012-12-31 NOTE — Patient Instructions (Signed)
Cont medications as previous prescribed  Next time bring all medications to visit so we can make sure you list is correct  Will need to see DR PANDEY in 3 months (30 min appt) Will get fasting lab work done before visit.

## 2012-12-31 NOTE — Progress Notes (Signed)
Patient ID: Raymond Stephens, male   DOB: February 19, 1946, 67 y.o.   MRN: [id number]   No Known Allergies  Chief Complaint  Patient presents with  . Medical Managment of Chronic Issues    2 week f/u cough, BP    HPI: Patient is a 67 y.o. male seen in the office today for follow up blood pressure and cough Since last visit pt has been taking losartan and norvasc for bp (HCTZ was stopped due to possible side effects of feeling fatigued that he noticed when starting losartan and HCTZ- HCTZ was stopped first to evaluate) pt reports he has been doing well on losartan and norvasc; the feeling of fatigued has not returned  Also pt has been taking omeprazole due to chronic cough; pt reports cough has improved since on omeprazole daily Review of Systems:  Review of Systems  Constitutional: Negative for fever, chills, weight loss and malaise/fatigue.  HENT: Negative for congestion, ear pain, hearing loss, nosebleeds, sore throat and tinnitus.   Respiratory: Positive for cough (improved). Negative for sputum production, shortness of breath and wheezing.   Cardiovascular: Negative for chest pain, palpitations and leg swelling.  Gastrointestinal: Negative for heartburn, abdominal pain, diarrhea, constipation, blood in stool and melena.  Musculoskeletal: Negative for falls, joint pain and myalgias.  Skin: Negative.   Neurological: Negative for dizziness, tingling, sensory change, weakness and headaches.     Past Medical History  Diagnosis Date  . Hypertension    History reviewed. No pertinent past surgical history. Social History:   reports that he quit smoking about 20 years ago. His smoking use included Cigarettes. He smoked 0.00 packs per day. He has quit using smokeless tobacco. His smokeless tobacco use included Chew. He reports that he does not drink alcohol or use illicit drugs.  Family History  Problem Relation Age of Onset  . Heart disease Mother   . Cancer Father      Medications: Patient's Medications  New Prescriptions   No medications on file  Previous Medications   AMLODIPINE (NORVASC) 10 MG TABLET    Take 1 tablet (10 mg total) by mouth daily.   DEXTROMETHORPHAN-GUAIFENESIN (MUCINEX DM) 30-600 MG PER 12 HR TABLET    Take 1 tablet by mouth every 12 (twelve) hours.   LOSARTAN (COZAAR) 50 MG TABLET    Take 1 tablet (50 mg total) by mouth daily.   OMEPRAZOLE (PRILOSEC) 20 MG CAPSULE    Take 1 capsule (20 mg total) by mouth daily.  Modified Medications   No medications on file  Discontinued Medications   HYDROCHLOROTHIAZIDE (HYDRODIURIL) 25 MG TABLET    Take 1 tablet (25 mg total) by mouth daily.     Physical Exam:  Filed Vitals:   12/31/12 1425  BP: 132/78  Pulse: 80  Temp: 98.1 F (36.7 C)  TempSrc: Oral  Resp: 16  Weight: 212 lb 12.8 oz (96.525 kg)  SpO2: 96%    Physical Exam  Vitals reviewed. Constitutional: He is oriented to person, place, and time and well-developed, well-nourished, and in no distress. No distress.  HENT:  Head: Normocephalic and atraumatic.  Right Ear: External ear normal.  Left Ear: External ear normal.  Mouth/Throat: Oropharynx is clear and moist. No oropharyngeal exudate.  Eyes: Conjunctivae and EOM are normal. Pupils are equal, round, and reactive to light.  Neck: Normal range of motion. Neck supple.  Cardiovascular: Normal rate, regular rhythm and normal heart sounds.   Pulmonary/Chest: Effort normal and breath sounds normal.  Abdominal: Soft. Bowel sounds  are normal.  Musculoskeletal: Normal range of motion. He exhibits no edema.  Neurological: He is alert and oriented to person, place, and time.  Skin: Skin is warm and dry. He is not diaphoretic.     Labs reviewed: Basic Metabolic Panel:  Recent Labs  16/10/96 1211 12/15/12 0831  NA 145* 144  K 4.6 4.0  CL 104 102  CO2 28 28  GLUCOSE 90 90  BUN 8 12  CREATININE 1.01 1.14  CALCIUM 9.6 9.6   Liver Function Tests:  Recent Labs   11/05/12 1211 12/15/12 0831  AST 21 19  ALT 23 27  ALKPHOS 144* 137*  BILITOT 0.2 0.3  PROT 7.0 6.7   No results found for this basename: LIPASE, AMYLASE,  in the last 8760 hours No results found for this basename: AMMONIA,  in the last 8760 hours CBC:  Recent Labs  11/05/12 1211  WBC 10.9*  NEUTROABS 6.8  HGB 14.3  HCT 47.1  MCV 75*  PLT 251   Lipid Panel:  Recent Labs  12/15/12 0831  HDL 32*  LDLCALC 106*  TRIG 160*  CHOLHDL 5.3*    Assessment/Plan 1. Other and unspecified hyperlipidemia -diet and exercise modifications  - Lipid panel; Future to follow up hyperlipidemia and effects of diet and exercise   2. Essential hypertension, benign -will cont current medications; follow up bmp before next visit  3. Cough -improved on omeprazole will cont this -pt still taking mucinex educated he does not need to take this routinely and to stop at this time   Will have 3 month routine follow up with Glade Lloyd, MD and then pt may be seen by me for further follow up

## 2013-02-16 ENCOUNTER — Ambulatory Visit (AMBULATORY_SURGERY_CENTER): Payer: Medicare HMO | Admitting: *Deleted

## 2013-02-16 VITALS — Ht 67.0 in | Wt 208.6 lb

## 2013-02-16 DIAGNOSIS — Z1211 Encounter for screening for malignant neoplasm of colon: Secondary | ICD-10-CM

## 2013-02-16 MED ORDER — NA SULFATE-K SULFATE-MG SULF 17.5-3.13-1.6 GM/177ML PO SOLN: 1.0000 | Freq: Once | ORAL | Status: DC

## 2013-02-16 NOTE — Progress Notes (Signed)
No allergies to eggs or soy. No prior anesthesia.  

## 2013-02-25 ENCOUNTER — Encounter: Payer: Self-pay | Admitting: Internal Medicine

## 2013-03-02 ENCOUNTER — Encounter: Payer: Medicare HMO | Admitting: Internal Medicine

## 2013-04-09 ENCOUNTER — Other Ambulatory Visit: Payer: Commercial Managed Care - HMO

## 2013-04-09 DIAGNOSIS — E785 Hyperlipidemia, unspecified: Secondary | ICD-10-CM

## 2013-04-10 LAB — BASIC METABOLIC PANEL
BUN/Creatinine Ratio: 12 (ref 10–22)
BUN: 16 mg/dL (ref 8–27)
CO2: 27 mmol/L (ref 18–29)
Calcium: 9.4 mg/dL (ref 8.6–10.2)
Chloride: 101 mmol/L (ref 97–108)
Creatinine, Ser: 1.39 mg/dL — ABNORMAL HIGH (ref 0.76–1.27)
GFR calc Af Amer: 60 mL/min/{1.73_m2} (ref >59)
GFR, EST NON AFRICAN AMERICAN: 52 mL/min/{1.73_m2} — AB (ref >59)
GLUCOSE: 144 mg/dL — AB (ref 65–99)
Potassium: 4 mmol/L (ref 3.5–5.2)
Sodium: 142 mmol/L (ref 134–144)

## 2013-04-10 LAB — LIPID PANEL
CHOL/HDL RATIO: 4.8 ratio (ref 0.0–5.0)
CHOLESTEROL TOTAL: 144 mg/dL (ref 100–199)
HDL: 30 mg/dL — AB (ref >39)
LDL CALC: 93 mg/dL (ref 0–99)
TRIGLYCERIDES: 105 mg/dL (ref 0–149)
VLDL CHOLESTEROL CAL: 21 mg/dL (ref 5–40)

## 2013-04-13 ENCOUNTER — Ambulatory Visit (INDEPENDENT_AMBULATORY_CARE_PROVIDER_SITE_OTHER): Payer: Medicare HMO | Admitting: Internal Medicine

## 2013-04-13 ENCOUNTER — Encounter: Payer: Self-pay | Admitting: Internal Medicine

## 2013-04-13 VITALS — BP 124/82 | HR 77 | Temp 98.5°F | Wt 211.0 lb

## 2013-04-13 DIAGNOSIS — R739 Hyperglycemia, unspecified: Secondary | ICD-10-CM

## 2013-04-13 DIAGNOSIS — R7309 Other abnormal glucose: Secondary | ICD-10-CM

## 2013-04-13 DIAGNOSIS — R350 Frequency of micturition: Secondary | ICD-10-CM

## 2013-04-13 DIAGNOSIS — E785 Hyperlipidemia, unspecified: Secondary | ICD-10-CM

## 2013-04-13 DIAGNOSIS — K219 Gastro-esophageal reflux disease without esophagitis: Secondary | ICD-10-CM

## 2013-04-13 DIAGNOSIS — N289 Disorder of kidney and ureter, unspecified: Secondary | ICD-10-CM

## 2013-04-13 DIAGNOSIS — I10 Essential (primary) hypertension: Secondary | ICD-10-CM

## 2013-04-13 NOTE — Progress Notes (Signed)
Patient ID: Raymond Stephens, male   DOB: Apr 21, 1945, 68 y.o.   MRN: [id number]     Chief Complaint  Patient presents with  . Medical Managment of Chronic Issues    follow-up, discuss labs (copy printed)    HPI 68 y/o male pt is here for routine visit. He denies any complaints this visit. Reviewed his labs. Denies having any concerns this visit. He walks for exercise 30 minutes everyday. Careful about his meals- has cut down on fried food  Review of Systems  Constitutional: Negative for fever, chills, weight loss, malaise/fatigue and diaphoresis.  HENT: Negative for congestion, hearing loss and sore throat.   Eyes: Negative for blurred vision, double vision and discharge.  Respiratory: Negative for cough, sputum production, shortness of breath and wheezing.   Cardiovascular: Negative for chest pain, palpitations, orthopnea and leg swelling.  Gastrointestinal: Negative for heartburn, nausea, vomiting, abdominal pain, diarrhea and constipation.  Genitourinary: Negative for dysuria, urgency, flank pain. has nocturia and increased urine frequency Musculoskeletal: Negative for back pain, falls, joint pain and myalgias.  Skin: Negative for itching and rash.  Neurological: Negative for dizziness, tingling, focal weakness and headaches.  Psychiatric/Behavioral: Negative for depression and memory loss. The patient is not nervous/anxious.    Past Medical History  Diagnosis Date  . Hypertension   . GERD (gastroesophageal reflux disease)   . Enlarged prostate    Past Surgical History  Procedure Laterality Date  . No prior surgery     Current Outpatient Prescriptions on File Prior to Visit  Medication Sig Dispense Refill  . amLODipine (NORVASC) 10 MG tablet Take 1 tablet (10 mg total) by mouth daily.  90 tablet  3  . hydrochlorothiazide (HYDRODIURIL) 25 MG tablet Take 25 mg by mouth daily.       Marland Kitchen losartan (COZAAR) 50 MG tablet Take 1 tablet (50 mg total) by mouth daily.  30 tablet  3  .  omeprazole (PRILOSEC) 20 MG capsule Take 1 capsule (20 mg total) by mouth daily.  30 capsule  3   No current facility-administered medications on file prior to visit.   Physical exam BP 124/82  Pulse 77  Temp(Src) 98.5 F (36.9 C) (Oral)  Wt 211 lb (95.709 kg)  SpO2 98%  Constitutional: He is oriented to person, place, and time and well-developed, well-nourished, and in no distress. No distress.  HENT:   Head: Normocephalic and atraumatic.  Right Ear: External ear normal.  Left Ear: External ear normal.   Mouth/Throat: Oropharynx is clear and moist. No oropharyngeal exudate.  Eyes: Conjunctivae and EOM are normal. Pupils are equal, round, and reactive to light.  Neck: Normal range of motion. Neck supple.  Cardiovascular: Normal rate, regular rhythm and normal heart sounds.   Pulmonary/Chest: Effort normal and breath sounds normal.  Abdominal: Soft. Bowel sounds are normal.  Musculoskeletal: Normal range of motion. He exhibits no edema.  Neurological: He is alert and oriented to person, place, and time. No focal deficit Skin: Skin is warm and dry. He is not diaphoretic.    Labs- CBC    Component Value Date/Time   WBC 10.9* 11/05/2012 1211   RBC 6.28* 11/05/2012 1211   HGB 14.3 11/05/2012 1211   HCT 47.1 11/05/2012 1211   PLT 251 11/05/2012 1211   MCV 75* 11/05/2012 1211   MCH 22.8* 11/05/2012 1211   MCHC 30.4* 11/05/2012 1211   RDW 17.6* 11/05/2012 1211   LYMPHSABS 2.6 11/05/2012 1211   EOSABS 0.6* 11/05/2012 1211   BASOSABS 0.1  11/05/2012 1211    CMP     Component Value Date/Time   NA 142 04/09/2013 0955   K 4.0 04/09/2013 0955   CL 101 04/09/2013 0955   CO2 27 04/09/2013 0955   GLUCOSE 144* 04/09/2013 0955   BUN 16 04/09/2013 0955   CREATININE 1.39* 04/09/2013 0955   CALCIUM 9.4 04/09/2013 0955   PROT 6.7 12/15/2012 0831   AST 19 12/15/2012 0831   ALT 27 12/15/2012 0831   ALKPHOS 137* 12/15/2012 0831   BILITOT 0.3 12/15/2012 0831   GFRNONAA 52* 04/09/2013 0955   GFRAA 60  04/09/2013 0955   Lipid Panel     Component Value Date/Time   TRIG 105 04/09/2013 0955   HDL 30* 04/09/2013 0955   CHOLHDL 4.8 04/09/2013 0955   LDLCALC 93 04/09/2013 0955    Assessment/plan  1. Essential hypertension, benign Stable bp reading. Reviewed BMP. Continue losartan, hctz and amlodipine for now. Monitor renal function. I  2. Other and unspecified hyperlipidemia Reviewed lipid panel. Mildly elevated TG level. Doing dietary modification. Not on any med at present. Monitor clinically  3. GERD (gastroesophageal reflux disease) Stable on current dose of omeprazole, continue this  4. Urinary frequency With elevated blood sugar reading, rule out DM first. Has hx of BPH and this likely is causing it. Consider treatment for this once DM is ruled out  5. Hyperglycemia Check a1c to rule out dm. Has hx of HTN and HL. - Hemoglobin A1c  6. Renal impairment New, likely has CKD in setting of long standing HTN. Monitor renal function. Avoid NSAIDs

## 2013-04-14 LAB — HEMOGLOBIN A1C
Est. average glucose Bld gHb Est-mCnc: 148 mg/dL
Hgb A1c MFr Bld: 6.8 % — ABNORMAL HIGH (ref 4.8–5.6)

## 2013-04-26 ENCOUNTER — Encounter: Payer: Self-pay | Admitting: Pharmacotherapy

## 2013-04-26 ENCOUNTER — Ambulatory Visit (INDEPENDENT_AMBULATORY_CARE_PROVIDER_SITE_OTHER): Payer: Medicare HMO | Admitting: Pharmacotherapy

## 2013-04-26 VITALS — BP 142/84 | HR 100 | Resp 10 | Wt 210.0 lb

## 2013-04-26 DIAGNOSIS — R05 Cough: Secondary | ICD-10-CM

## 2013-04-26 DIAGNOSIS — E119 Type 2 diabetes mellitus without complications: Secondary | ICD-10-CM

## 2013-04-26 DIAGNOSIS — R059 Cough, unspecified: Secondary | ICD-10-CM

## 2013-04-26 DIAGNOSIS — I10 Essential (primary) hypertension: Secondary | ICD-10-CM

## 2013-04-26 MED ORDER — METFORMIN HCL 500 MG PO TABS: 500.0000 mg | ORAL_TABLET | Freq: Two times a day (BID) | ORAL | Status: DC

## 2013-04-26 MED ORDER — HYDROCHLOROTHIAZIDE 25 MG PO TABS: 25.0000 mg | ORAL_TABLET | Freq: Every day | ORAL | Status: DC

## 2013-04-26 MED ORDER — LOSARTAN POTASSIUM 50 MG PO TABS: 50.0000 mg | ORAL_TABLET | Freq: Every day | ORAL | Status: DC

## 2013-04-26 MED ORDER — ONETOUCH DELICA LANCETS FINE MISC
1.0000 | Freq: Every day | Status: AC
Start: 2013-04-26 — End: ?

## 2013-04-26 MED ORDER — OMEPRAZOLE 20 MG PO CPDR: 20.0000 mg | DELAYED_RELEASE_CAPSULE | Freq: Every day | ORAL | Status: DC

## 2013-04-26 MED ORDER — ONETOUCH VERIO VI STRP
ORAL_STRIP | Status: AC
Start: 2013-04-26 — End: ?

## 2013-04-26 NOTE — Patient Instructions (Signed)
Start Metformin 500mg  twice daily. Monitor blood glucose at least 3 times a week.

## 2013-04-26 NOTE — Progress Notes (Signed)
  Subjective:    Raymond Stephens is a 68 y.o.African American male who presents for follow-up of Type 2 diabetes mellitus.   He was just diagnosed with DM 2 with an A1C of 6.8% He does have a significant FH of DM with sister and mother. He has started to eat healthier.  He doesn't skip meals often.  He has decreased portion sizes. He walks for exercise.  At least 3 times per week.  Denies problems with feet. Denies problems with vision. Nocturia 2-3 times per night.  Does not own a blood glucose meter. Random BG:  102mg /dl  Review of Systems A comprehensive review of systems was negative except for: Genitourinary: positive for nocturia    Objective:    BP 142/84  Pulse 100  Resp 10  Wt 210 lb (95.255 kg)  SpO2 94%  General:  alert, cooperative and no distress  Oropharynx: normal findings: lips normal without lesions and gums healthy   Eyes:  negative findings: lids and lashes normal and conjunctivae and sclerae normal   Ears:  external ears normal        Lung: clear to auscultation bilaterally  Heart:  regular rate and rhythm     Extremities: no edema  Skin: warm and dry, no hyperpigmentation, vitiligo, or suspicious lesions     Neuro: mental status, speech normal, alert and oriented x3 and gait and station normal   Lab Review Glucose (mg/dL)  Date Value  1/61/09601/16/2015 144*  12/15/2012 90   11/05/2012 90      CO2 (mmol/L)  Date Value  04/09/2013 27   12/15/2012 28   11/05/2012 28      BUN (mg/dL)  Date Value  4/54/09811/16/2015 16   12/15/2012 12   11/05/2012 8      Creatinine, Ser (mg/dL)  Date Value  1/91/47821/16/2015 1.39*  12/15/2012 1.14   11/05/2012 1.01        Assessment:    Diabetes Mellitus type II, under good control.  BP at goal <140/90   Plan:    1.  Rx changes: Start Metformin 500mg  twice daily.  Counseled on risk / benefit and potential for loose stools as he starts the medication. 2.  Provided One Touch Verio IQ blood glucose meter and instructed on use.   He should check BG at least 3 times per week. 3.  Counseled on complications of uncontrolled DM. 4.  Counseled on nutrition goals and meal planning. 5.  Counseled on benefit of routine exercise.  Goal is 30-45 minutes 5 x week. 6.  BP at goal <140/90.  Continue amlodipine, losartan, HCTZ. 7.  RTC in 6 weeks.

## 2013-06-07 ENCOUNTER — Encounter: Payer: Self-pay | Admitting: Pharmacotherapy

## 2013-06-07 ENCOUNTER — Ambulatory Visit (INDEPENDENT_AMBULATORY_CARE_PROVIDER_SITE_OTHER): Payer: Medicare HMO | Admitting: Pharmacotherapy

## 2013-06-07 VITALS — BP 138/84 | HR 86 | Temp 97.6°F | Resp 14 | Wt 203.8 lb

## 2013-06-07 DIAGNOSIS — I10 Essential (primary) hypertension: Secondary | ICD-10-CM

## 2013-06-07 DIAGNOSIS — E119 Type 2 diabetes mellitus without complications: Secondary | ICD-10-CM

## 2013-06-07 MED ORDER — TETANUS-DIPHTH-ACELL PERTUSSIS 5-2.5-18.5 LF-MCG/0.5 IM SUSP: 0.5000 mL | Freq: Once | INTRAMUSCULAR | Status: DC

## 2013-06-07 NOTE — Progress Notes (Signed)
  Subjective:    Raymond Stephens is a 68 y.o.African American male who presents for follow-up of Type 2 diabetes mellitus.   6 weeks ago he was started on Metformin. No problems with metformin. Average BG:  140m/dl No hypoglycemia. Doing better with planning meals - says he still has some work to do. Walking for exercise.  30 minutes daily. Denies problems with feet. Denies problems with vision.  Last eye exam was 2 years ago. Nocturia 2 times per night  Denies peripheral edema.   Review of Systems A comprehensive review of systems was negative except for: Genitourinary: positive for nocturia    Objective:    BP 138/84  Pulse 86  Temp(Src) 97.6 F (36.4 C) (Oral)  Resp 14  Wt 203 lb 12.8 oz (92.443 kg)  SpO2 95%  General:  alert, cooperative and no distress  Oropharynx: normal findings: lips normal without lesions and gums healthy   Eyes:  negative findings: lids and lashes normal and corneas clear   Ears:  external ears normal        Lung: clear to auscultation bilaterally  Heart:  regular rate and rhythm     Extremities: no edema  Skin: dry     Neuro: mental status, speech normal, alert and oriented x3 and gait and station normal   Lab Review Glucose (mg/dL)  Date Value  04/09/2013 144*  12/15/2012 90   11/05/2012 90      CO2 (mmol/L)  Date Value  04/09/2013 27   12/15/2012 28   11/05/2012 28      BUN (mg/dL)  Date Value  04/09/2013 16   12/15/2012 12   11/05/2012 8      Creatinine, Ser (mg/dL)  Date Value  04/09/2013 1.39*  12/15/2012 1.14   11/05/2012 1.01        Assessment:    Diabetes Mellitus type II, under excellent control. Average BG is excellent without hypoglycemia. BP at goal <140/90   Plan:    1.  Rx changes: none 2.  Continue Metformin 5037mtwice daily. 3.  Counseled on foot care. 4.  Reviewed nutrition goals. 5.  Praised exercise efforts. 6.  HTN at goal <140/90.  Continue losartan, amlodipine, HCTZ. 7.  Recommended he get  annual eye exam. 8.  RTC in 2 months - lab prior:  A1C, CMP, FLP, microalbumin

## 2013-06-07 NOTE — Patient Instructions (Signed)
Keep up the good work

## 2013-07-14 ENCOUNTER — Ambulatory Visit (INDEPENDENT_AMBULATORY_CARE_PROVIDER_SITE_OTHER): Payer: Medicare HMO | Admitting: Nurse Practitioner

## 2013-07-14 ENCOUNTER — Encounter: Payer: Self-pay | Admitting: Nurse Practitioner

## 2013-07-14 VITALS — BP 140/78 | HR 80 | Temp 98.8°F | Resp 12 | Ht 67.5 in | Wt 200.4 lb

## 2013-07-14 DIAGNOSIS — E119 Type 2 diabetes mellitus without complications: Secondary | ICD-10-CM

## 2013-07-14 DIAGNOSIS — R05 Cough: Secondary | ICD-10-CM | POA: Insufficient documentation

## 2013-07-14 DIAGNOSIS — I10 Essential (primary) hypertension: Secondary | ICD-10-CM

## 2013-07-14 DIAGNOSIS — R059 Cough, unspecified: Secondary | ICD-10-CM

## 2013-07-14 DIAGNOSIS — E785 Hyperlipidemia, unspecified: Secondary | ICD-10-CM

## 2013-07-14 NOTE — Progress Notes (Signed)
Patient ID: Raymond Stephens, male   DOB: 04/01/1945, 68 y.o.   MRN: [id number]    No Known Allergies  Chief Complaint  Patient presents with  . Medical Management of Chronic Issues    3 month follow-up, not fasting today     HPI: Patient is a 68 y.o. male seen in the office today for routine follow up on chronic conditions. Pt without concerns today Report he is still coughing; initially was started on omeprazole which helped the cough for a few months and now it has come back- and now it has been back for about a month. Report a productive cough now. No fevers or chills. Associates cough with a cold, no shortness of breath, chest pains.   Since last visit was dx with type 2 DM, checks blood sugars 3 times a week, ranging from 94-160 fasting and post prandial. No current side effects from metformin (was having loose stools but this has resolved.)   Still walking 3730mins/5 days a week. Trying to follow diabetic diet. Last eye exam was over 2 years ago Review of Systems:  Review of Systems  Constitutional: Negative for fever, chills, weight loss and malaise/fatigue.  HENT: Negative for congestion, ear pain, hearing loss, nosebleeds, sore throat and tinnitus.   Respiratory: Positive for cough and sputum production. Negative for shortness of breath and wheezing.   Cardiovascular: Negative for chest pain, palpitations and leg swelling.  Gastrointestinal: Negative for heartburn, abdominal pain, diarrhea, constipation, blood in stool and melena.  Musculoskeletal: Negative for falls, joint pain and myalgias.  Skin: Negative.   Neurological: Negative for dizziness, tingling, sensory change, weakness and headaches.     Past Medical History  Diagnosis Date  . Hypertension   . GERD (gastroesophageal reflux disease)   . Enlarged prostate    Past Surgical History  Procedure Laterality Date  . No prior surgery     Social History:   reports that he quit smoking about 21 years ago. His smoking  use included Cigarettes. He smoked 0.00 packs per day. He quit smokeless tobacco use about 51 years ago. His smokeless tobacco use included Chew. He reports that he does not drink alcohol or use illicit drugs.  Family History  Problem Relation Age of Onset  . Heart disease Mother   . Cancer Father   . Colon cancer Neg Hx     Medications: Patient's Medications  New Prescriptions   No medications on file  Previous Medications   AMLODIPINE (NORVASC) 10 MG TABLET    Take 1 tablet (10 mg total) by mouth daily.   HYDROCHLOROTHIAZIDE (HYDRODIURIL) 25 MG TABLET    Take 1 tablet (25 mg total) by mouth daily.   LOSARTAN (COZAAR) 50 MG TABLET    Take 1 tablet (50 mg total) by mouth daily.   METFORMIN (GLUCOPHAGE) 500 MG TABLET    Take 1 tablet (500 mg total) by mouth 2 (two) times daily with a meal.   OMEPRAZOLE (PRILOSEC) 20 MG CAPSULE    Take 1 capsule (20 mg total) by mouth daily.   ONETOUCH DELICA LANCETS FINE MISC    1 each by Does not apply route daily.   ONETOUCH VERIO TEST STRIP    Use to monitor blood glucose daily (dx 250.00)   TDAP (BOOSTRIX) 5-2.5-18.5 LF-MCG/0.5 INJECTION    Inject 0.5 mLs into the muscle once.  Modified Medications   No medications on file  Discontinued Medications   No medications on file     Physical Exam:  Filed Vitals:   07/14/13 1046  BP: 140/78  Pulse: 80  Temp: 98.8 F (37.1 C)  TempSrc: Oral  Resp: 12  Height: 5' 7.5" (1.715 m)  Weight: 200 lb 6.4 oz (90.901 kg)    Physical Exam  Vitals reviewed. Constitutional: He is oriented to person, place, and time and well-developed, well-nourished, and in no distress.  HENT:  Head: Normocephalic and atraumatic.  Right Ear: External ear normal.  Left Ear: External ear normal.  Nose: Nose normal.  Mouth/Throat: Oropharynx is clear and moist. No oropharyngeal exudate.  Eyes: Conjunctivae and EOM are normal. Pupils are equal, round, and reactive to light.  Neck: Normal range of motion. Neck supple.    Cardiovascular: Normal rate, regular rhythm and normal heart sounds.   Pulmonary/Chest: Effort normal and breath sounds normal. No respiratory distress. He has no wheezes. He has no rales. He exhibits no tenderness.  Abdominal: Soft. Bowel sounds are normal.  Musculoskeletal: Normal range of motion. He exhibits no edema and no tenderness.  Neurological: He is alert and oriented to person, place, and time.  Skin: Skin is warm and dry. He is not diaphoretic.  Psychiatric: Affect normal.     Labs reviewed: Basic Metabolic Panel:  Recent Labs  16/12/9606/14/14 1211 12/15/12 0831 04/09/13 0955  NA 145* 144 142  K 4.6 4.0 4.0  CL 104 102 101  CO2 28 28 27   GLUCOSE 90 90 144*  BUN 8 12 16   CREATININE 1.01 1.14 1.39*  CALCIUM 9.6 9.6 9.4   Liver Function Tests:  Recent Labs  11/05/12 1211 12/15/12 0831  AST 21 19  ALT 23 27  ALKPHOS 144* 137*  BILITOT 0.2 0.3  PROT 7.0 6.7   No results found for this basename: LIPASE, AMYLASE,  in the last 8760 hours No results found for this basename: AMMONIA,  in the last 8760 hours CBC:  Recent Labs  11/05/12 1211  WBC 10.9*  NEUTROABS 6.8  HGB 14.3  HCT 47.1  MCV 75*  PLT 251   Lipid Panel:  Recent Labs  12/15/12 0831 04/09/13 0955  HDL 32* 30*  LDLCALC 106* 93  TRIG 160* 105  CHOLHDL 5.3* 4.8   TSH: No results found for this basename: TSH,  in the last 8760 hours A1C: Lab Results  Component Value Date   HGBA1C 6.8* 04/13/2013    Assessment/Plan 1. Essential hypertension, benign -well controlled on Norvasc HCTz and cozaar -in the past was having side effects with Hctz but now tolerating without adverse effects  -has lab work pending  2. Type 2 DM -conts lifestyle modification -tolerating metformin, has follow up A1c with cathy next month -educated to get eye exam -conts to follow up with cathy pharm d  3. Other and unspecified hyperlipidemia -diet and exercise, has scheduled fasting lipids   4. Cough -has  been well controlled on omeprazole but recently with cough and congestion -mucinex DM twice daily with increase water intake  -to follow up in 1 month if cough has not resolved

## 2013-07-14 NOTE — Patient Instructions (Signed)
To take mucinex DM twice daily with full glass of water   To follow up in 1 month if cough as not resolved Otherwise follow up in 6 month with Shanda BumpsJessica-- and keep appt with Andris Baumannathy pharm D

## 2013-08-05 ENCOUNTER — Other Ambulatory Visit: Payer: Commercial Managed Care - HMO

## 2013-08-05 DIAGNOSIS — E119 Type 2 diabetes mellitus without complications: Secondary | ICD-10-CM

## 2013-08-06 LAB — COMPREHENSIVE METABOLIC PANEL
ALT: 12 IU/L (ref 0–44)
AST: 15 IU/L (ref 0–40)
Albumin/Globulin Ratio: 1.8 (ref 1.1–2.5)
Albumin: 4.4 g/dL (ref 3.6–4.8)
Alkaline Phosphatase: 99 IU/L (ref 39–117)
BUN/Creatinine Ratio: 15 (ref 10–22)
BUN: 21 mg/dL (ref 8–27)
CO2: 27 mmol/L (ref 18–29)
Calcium: 10.2 mg/dL (ref 8.6–10.2)
Chloride: 101 mmol/L (ref 97–108)
Creatinine, Ser: 1.37 mg/dL — ABNORMAL HIGH (ref 0.76–1.27)
GFR calc Af Amer: 60 mL/min/{1.73_m2} (ref >59)
GFR calc non Af Amer: 52 mL/min/{1.73_m2} — ABNORMAL LOW (ref >59)
Globulin, Total: 2.5 g/dL (ref 1.5–4.5)
Glucose: 85 mg/dL (ref 65–99)
Potassium: 4.3 mmol/L (ref 3.5–5.2)
Sodium: 144 mmol/L (ref 134–144)
Total Bilirubin: 0.3 mg/dL (ref 0.0–1.2)
Total Protein: 6.9 g/dL (ref 6.0–8.5)

## 2013-08-06 LAB — MICROALBUMIN / CREATININE URINE RATIO
Creatinine, Ur: 208.5 mg/dL (ref 22.0–328.0)
MICROALB/CREAT RATIO: 10.4 mg/g creat (ref 0.0–30.0)
Microalbumin, Urine: 21.6 ug/mL — ABNORMAL HIGH (ref 0.0–17.0)

## 2013-08-06 LAB — LIPID PANEL
Chol/HDL Ratio: 4.9 ratio units (ref 0.0–5.0)
Cholesterol, Total: 157 mg/dL (ref 100–199)
HDL: 32 mg/dL — ABNORMAL LOW (ref >39)
LDL Calculated: 101 mg/dL — ABNORMAL HIGH (ref 0–99)
Triglycerides: 119 mg/dL (ref 0–149)
VLDL Cholesterol Cal: 24 mg/dL (ref 5–40)

## 2013-08-06 LAB — HEMOGLOBIN A1C
Est. average glucose Bld gHb Est-mCnc: 134 mg/dL
Hgb A1c MFr Bld: 6.3 % — ABNORMAL HIGH (ref 4.8–5.6)

## 2013-08-09 ENCOUNTER — Encounter: Payer: Self-pay | Admitting: Pharmacotherapy

## 2013-08-09 ENCOUNTER — Ambulatory Visit (INDEPENDENT_AMBULATORY_CARE_PROVIDER_SITE_OTHER): Payer: Commercial Managed Care - HMO | Admitting: Pharmacotherapy

## 2013-08-09 VITALS — BP 122/70 | HR 91 | Temp 97.6°F | Wt 200.8 lb

## 2013-08-09 DIAGNOSIS — E119 Type 2 diabetes mellitus without complications: Secondary | ICD-10-CM

## 2013-08-09 DIAGNOSIS — E785 Hyperlipidemia, unspecified: Secondary | ICD-10-CM

## 2013-08-09 DIAGNOSIS — I10 Essential (primary) hypertension: Secondary | ICD-10-CM

## 2013-08-09 NOTE — Progress Notes (Signed)
  Subjective:    Raymond Stephens is a 68 y.o.African American male who presents for follow-up of Type 2 diabetes mellitus.   Denies problems with metformin. Average BG:  120mg /dl No hypoglycemia.  Lowest BG:  76mg /dl  Trying to eat healthy.  He skips breakfast frequently. Walking for exercise. Denies problems with feet. Denies problems with vision. Nocturia 2-3 times per night. Denies "polys" No edema.  Review of Systems A comprehensive review of systems was negative except for: Genitourinary: positive for nocturia    Objective:    BP 122/70  Pulse 91  Temp(Src) 97.6 F (36.4 C) (Oral)  Wt 200 lb 12.8 oz (91.082 kg)  SpO2 93%  General:  alert, cooperative, appears stated age and no distress  Oropharynx: normal findings: lips normal without lesions and gums healthy   Eyes:  negative findings: lids and lashes normal and conjunctivae and sclerae normal   Ears:  external ears normal        Lung: clear to auscultation bilaterally  Heart:  regular rate and rhythm     Extremities: no edema  Skin: warm and dry, no hyperpigmentation, vitiligo, or suspicious lesions     Neuro: mental status, speech normal, alert and oriented x3 and gait and station normal   Lab Review Glucose (mg/dL)  Date Value  1/61/09605/14/2015 85   04/09/2013 144*  12/15/2012 90      CO2 (mmol/L)  Date Value  08/05/2013 27   04/09/2013 27   12/15/2012 28      BUN (mg/dL)  Date Value  4/54/09815/14/2015 21   04/09/2013 16   12/15/2012 12      Creatinine, Ser (mg/dL)  Date Value  1/91/47825/14/2015 1.37*  04/09/2013 1.39*  12/15/2012 1.14     08/05/13: A1C:  6.3% AST:  15 ALT:  12 Total cholesterol:  157 Triglycerides:  119 HDL:  32 LDL:  101 Est CrCl:  60 ml/min Microalbumin:  - pending.   Assessment:    Diabetes Mellitus type II, under excellent control.  BP at goal <140/90 LDL close to goal <100   Plan:    1.  Rx changes: none 2.  Continue metformin. 3.  Counseled on nutrition goals. 4.  Counseled on  exercise goals.  30-45 minutes 5 x week.. 5.  BP at goal <140/90 6.  Lipids well controlled.

## 2013-08-09 NOTE — Patient Instructions (Signed)
Diabetes control is excellent! Keep up the good work.

## 2013-08-30 ENCOUNTER — Other Ambulatory Visit: Payer: Self-pay | Admitting: *Deleted

## 2013-08-30 DIAGNOSIS — R059 Cough, unspecified: Secondary | ICD-10-CM

## 2013-08-30 DIAGNOSIS — R05 Cough: Secondary | ICD-10-CM

## 2013-08-30 MED ORDER — OMEPRAZOLE 20 MG PO CPDR: 20.0000 mg | DELAYED_RELEASE_CAPSULE | Freq: Every day | ORAL | Status: DC

## 2013-08-30 NOTE — Telephone Encounter (Signed)
[facility] 

## 2013-09-27 ENCOUNTER — Other Ambulatory Visit: Payer: Self-pay | Admitting: Nurse Practitioner

## 2013-11-30 ENCOUNTER — Other Ambulatory Visit: Payer: Self-pay | Admitting: *Deleted

## 2013-11-30 MED ORDER — HYDROCHLOROTHIAZIDE 25 MG PO TABS: ORAL_TABLET | ORAL | Status: DC

## 2013-11-30 NOTE — Telephone Encounter (Signed)
[facility]

## 2013-12-09 ENCOUNTER — Other Ambulatory Visit: Payer: Commercial Managed Care - HMO

## 2013-12-09 DIAGNOSIS — E119 Type 2 diabetes mellitus without complications: Secondary | ICD-10-CM

## 2013-12-09 DIAGNOSIS — E785 Hyperlipidemia, unspecified: Secondary | ICD-10-CM

## 2013-12-10 LAB — COMPREHENSIVE METABOLIC PANEL
ALT: 11 IU/L (ref 0–44)
AST: 18 IU/L (ref 0–40)
Albumin/Globulin Ratio: 1.5 (ref 1.1–2.5)
Albumin: 4.3 g/dL (ref 3.6–4.8)
Alkaline Phosphatase: 108 IU/L (ref 39–117)
BUN/Creatinine Ratio: 16 (ref 10–22)
BUN: 21 mg/dL (ref 8–27)
CO2: 27 mmol/L (ref 18–29)
Calcium: 10.5 mg/dL — ABNORMAL HIGH (ref 8.6–10.2)
Chloride: 99 mmol/L (ref 97–108)
Creatinine, Ser: 1.35 mg/dL — ABNORMAL HIGH (ref 0.76–1.27)
GFR calc Af Amer: 61 mL/min/{1.73_m2} (ref >59)
GFR calc non Af Amer: 53 mL/min/{1.73_m2} — ABNORMAL LOW (ref >59)
Globulin, Total: 2.9 g/dL (ref 1.5–4.5)
Glucose: 77 mg/dL (ref 65–99)
Potassium: 4.3 mmol/L (ref 3.5–5.2)
Sodium: 142 mmol/L (ref 134–144)
Total Bilirubin: 0.3 mg/dL (ref 0.0–1.2)
Total Protein: 7.2 g/dL (ref 6.0–8.5)

## 2013-12-10 LAB — HEMOGLOBIN A1C
Est. average glucose Bld gHb Est-mCnc: 137 mg/dL
Hgb A1c MFr Bld: 6.4 % — ABNORMAL HIGH (ref 4.8–5.6)

## 2013-12-10 LAB — LIPID PANEL
Chol/HDL Ratio: 4.6 ratio units (ref 0.0–5.0)
Cholesterol, Total: 161 mg/dL (ref 100–199)
HDL: 35 mg/dL — ABNORMAL LOW (ref >39)
LDL Calculated: 105 mg/dL — ABNORMAL HIGH (ref 0–99)
Triglycerides: 106 mg/dL (ref 0–149)
VLDL Cholesterol Cal: 21 mg/dL (ref 5–40)

## 2013-12-13 ENCOUNTER — Encounter: Payer: Self-pay | Admitting: Pharmacotherapy

## 2013-12-13 ENCOUNTER — Ambulatory Visit (INDEPENDENT_AMBULATORY_CARE_PROVIDER_SITE_OTHER): Payer: Commercial Managed Care - HMO | Admitting: Pharmacotherapy

## 2013-12-13 VITALS — BP 142/88 | HR 89 | Temp 97.9°F | Resp 18 | Ht 67.5 in | Wt 195.6 lb

## 2013-12-13 DIAGNOSIS — E785 Hyperlipidemia, unspecified: Secondary | ICD-10-CM

## 2013-12-13 DIAGNOSIS — E119 Type 2 diabetes mellitus without complications: Secondary | ICD-10-CM

## 2013-12-13 DIAGNOSIS — I10 Essential (primary) hypertension: Secondary | ICD-10-CM

## 2013-12-13 NOTE — Progress Notes (Signed)
  Subjective:    Raymond Stephens is a 68 y.o.African American male who presents for follow-up of Type 2 diabetes mellitus.   Tolerating Metformin without problems. Average BG:  /dl Hypoglycemia x 1 (lowest BG:  /dl)  "doin allright" on eating habits.  Still struggles with making healthy choices.  Denies skipping meals. However, when asked, he skipped supper last night and breakfast this morning. Walking for exercise daily.  30 minutes. Denies problems with feet. Denies problems with vision.  Eye exam is due. Nocturia 2-3 times per night. Denies peripheral edema.  Review of Systems A comprehensive review of systems was negative except for: Genitourinary: positive for nocturia    Objective:    BP 142/88  Pulse 89  Temp(Src) 97.9 F (36.6 C) (Oral)  Resp 18  Ht 5' 7.5" (1.715 m)  Wt 195 lb 9.6 oz (88.724 kg)  BMI 30.17 kg/m2  SpO2 97%  General:  alert, cooperative and no distress  Oropharynx: normal findings: lips normal without lesions and gums healthy   Eyes:  negative findings: lids and lashes normal and conjunctivae and sclerae normal   Ears:  external ears normal        Lung: clear to auscultation bilaterally  Heart:  regular rate and rhythm     Extremities: no edema noted  Skin: skin tags on neck     Neuro: mental status, speech normal, alert and oriented x3 and gait and station normal   Lab Review Glucose (mg/dL)  Date Value  07/01/8117 77   08/05/2013 85   04/09/2013 144*     CO2 (mmol/L)  Date Value  12/09/2013 27   08/05/2013 27   04/09/2013 27      BUN (mg/dL)  Date Value  1/47/8295 21   08/05/2013 21   04/09/2013 16      Creatinine, Ser (mg/dL)  Date Value  09/12/3084 1.35*  08/05/2013 1.37*  04/09/2013 1.39*    12/09/13: A1C:  6,4% AST:  18 ALT:  11 Total cholesterol:  161 Triglycerides:  106 HDL:  35 LDL:  105  Assessment:    Diabetes Mellitus type II, under excellent control. A1C at goal <7% BP slightly higher than target  <140/90 LDL slightly higher than target <100   Plan:    1.  Rx changes: none 2.  Continue metformin. 3.  Counseled on nutrition goals.  Needs to stop skipping meals. 4.  Exercise goal is 30-45 minutes 5 x week. 5.  Continue current RX for BP.  Will continue to monitor. 6.  No change to lipid therapy.

## 2013-12-13 NOTE — Patient Instructions (Signed)
A1C is great. No skipping meals. Continue with the exercise. Need to get eye exam

## 2014-01-03 ENCOUNTER — Other Ambulatory Visit: Payer: Self-pay | Admitting: Internal Medicine

## 2014-01-13 ENCOUNTER — Ambulatory Visit (INDEPENDENT_AMBULATORY_CARE_PROVIDER_SITE_OTHER): Payer: Commercial Managed Care - HMO | Admitting: Nurse Practitioner

## 2014-01-13 ENCOUNTER — Encounter: Payer: Self-pay | Admitting: Nurse Practitioner

## 2014-01-13 VITALS — BP 140/70 | HR 93 | Temp 96.5°F | Ht 67.0 in | Wt 193.8 lb

## 2014-01-13 DIAGNOSIS — I10 Essential (primary) hypertension: Secondary | ICD-10-CM

## 2014-01-13 DIAGNOSIS — E119 Type 2 diabetes mellitus without complications: Secondary | ICD-10-CM

## 2014-01-13 DIAGNOSIS — Z87891 Personal history of nicotine dependence: Secondary | ICD-10-CM

## 2014-01-13 DIAGNOSIS — N400 Enlarged prostate without lower urinary tract symptoms: Secondary | ICD-10-CM

## 2014-01-13 DIAGNOSIS — Z23 Encounter for immunization: Secondary | ICD-10-CM

## 2014-01-13 DIAGNOSIS — Z720 Tobacco use: Secondary | ICD-10-CM

## 2014-01-13 DIAGNOSIS — Z1211 Encounter for screening for malignant neoplasm of colon: Secondary | ICD-10-CM

## 2014-01-13 DIAGNOSIS — D509 Iron deficiency anemia, unspecified: Secondary | ICD-10-CM

## 2014-01-13 HISTORY — DX: Iron deficiency anemia, unspecified: D50.9

## 2014-01-13 MED ORDER — TETANUS-DIPHTH-ACELL PERTUSSIS 5-2.5-18.5 LF-MCG/0.5 IM SUSP: 0.5000 mL | Freq: Once | INTRAMUSCULAR | Status: AC

## 2014-01-13 MED ORDER — TETANUS-DIPHTH-ACELL PERTUSSIS 5-2.5-18.5 LF-MCG/0.5 IM SUSP: 0.5000 mL | Freq: Once | INTRAMUSCULAR | Status: DC

## 2014-01-13 NOTE — Patient Instructions (Signed)
Will get blood work today  Screening for lung cancer, colon cancer, prostate cancer have been ordered

## 2014-01-13 NOTE — Progress Notes (Signed)
Patient ID: Raymond HerbRobert Stephens, male   DOB: 08/22/1945, 89 y.o.   MRN: [id number]    No Known Allergies  Chief Complaint  Patient presents with  . Medical Management of Chronic Issues    6 month follow up    HPI: Patient is a 68 y.o. male seen in the office today for routine follow up on chronic conditions.  Reports everything as been going well.  Had eye exam 2 days ago, will follow up in a year.  Had flu shot today Has not had colorectal screening but is willing to do so.   Smoked from age [age]. Has not smoked since.  Has been taking omeprazole which has helped cough, but still conts to cough frequently   Review of Systems:  Review of Systems  Constitutional: Negative for fever, chills, weight loss and malaise/fatigue.  HENT: Negative for congestion, ear pain, hearing loss, nosebleeds, sore throat and tinnitus.   Respiratory: Positive for cough. Negative for sputum production, shortness of breath and wheezing.   Cardiovascular: Negative for chest pain, palpitations and leg swelling.  Gastrointestinal: Negative for heartburn, abdominal pain, diarrhea, constipation, blood in stool and melena.  Musculoskeletal: Negative for falls, joint pain and myalgias.  Skin: Negative.   Neurological: Negative for dizziness, tingling, sensory change, weakness and headaches.     Past Medical History  Diagnosis Date  . Hypertension   . GERD (gastroesophageal reflux disease)   . Enlarged prostate    Past Surgical History  Procedure Laterality Date  . No prior surgery     Social History:   reports that he quit smoking about 21 years ago. His smoking use included Cigarettes. He smoked 0.00 packs per day. He quit smokeless tobacco use about 51 years ago. His smokeless tobacco use included Chew. He reports that he does not drink alcohol or use illicit drugs.  Family History  Problem Relation Age of Onset  . Heart disease Mother   . Cancer Father   . Colon cancer Neg Hx      Medications: Patient's Medications  New Prescriptions   No medications on file  Previous Medications   AMLODIPINE (NORVASC) 10 MG TABLET    Take 1 tablet (10 mg total) by mouth daily.   HYDROCHLOROTHIAZIDE (HYDRODIURIL) 25 MG TABLET    Take one tablet by mouth once daily   LOSARTAN (COZAAR) 50 MG TABLET    TAKE ONE TABLET BY MOUTH ONCE DAILY   METFORMIN (GLUCOPHAGE) 500 MG TABLET    Take 1 tablet (500 mg total) by mouth 2 (two) times daily with a meal.   OMEPRAZOLE (PRILOSEC) 20 MG CAPSULE    TAKE ONE CAPSULE BY MOUTH ONCE DAILY   ONETOUCH DELICA LANCETS FINE MISC    1 each by Does not apply route daily.   ONETOUCH VERIO TEST STRIP    Use to monitor blood glucose daily (dx 250.00)  Modified Medications   Modified Medication Previous Medication   TDAP (BOOSTRIX) 5-2.5-18.5 LF-MCG/0.5 INJECTION Tdap (BOOSTRIX) 5-2.5-18.5 LF-MCG/0.5 injection      Inject 0.5 mLs into the muscle once.    Inject 0.5 mLs into the muscle once.  Discontinued Medications   LOSARTAN (COZAAR) 50 MG TABLET    Take 1 tablet (50 mg total) by mouth daily.     Physical Exam:  Filed Vitals:   01/13/14 1014  BP: 140/70  Pulse: 93  Temp: 96.5 F (35.8 C)  TempSrc: Oral  Height: 5\' 7"  (1.702 m)  Weight: 193 lb 12.8 oz (87.907 kg)  SpO2: 93%    Physical Exam  Vitals reviewed. Constitutional: He is oriented to person, place, and time and well-developed, well-nourished, and in no distress.  HENT:  Head: Normocephalic and atraumatic.  Right Ear: External ear normal.  Left Ear: External ear normal.  Nose: Nose normal.  Mouth/Throat: Oropharynx is clear and moist. No oropharyngeal exudate.  Eyes: Conjunctivae and EOM are normal. Pupils are equal, round, and reactive to light.  Neck: Normal range of motion. Neck supple.  Cardiovascular: Normal rate, regular rhythm and normal heart sounds.   Pulmonary/Chest: Effort normal and breath sounds normal. No respiratory distress. He has no wheezes. He has no rales.  He exhibits no tenderness.  Abdominal: Soft. Bowel sounds are normal.  Musculoskeletal: Normal range of motion. He exhibits no edema and no tenderness.  Neurological: He is alert and oriented to person, place, and time.  Skin: Skin is warm and dry. He is not diaphoretic.  Psychiatric: Affect normal.     Labs reviewed: Basic Metabolic Panel:  Recent Labs  13/10/6499/16/15 0955 08/05/13 0902 12/09/13 0928  NA 142 144 142  K 4.0 4.3 4.3  CL 101 101 99  CO2 27 27 27   GLUCOSE 144* 85 77  BUN 16 21 21   CREATININE 1.39* 1.37* 1.35*  CALCIUM 9.4 10.2 10.5*   Liver Function Tests:  Recent Labs  08/05/13 0902 12/09/13 0928  AST 15 18  ALT 12 11  ALKPHOS 99 108  BILITOT 0.3 0.3  PROT 6.9 7.2   No results found for this basename: LIPASE, AMYLASE,  in the last 8760 hours No results found for this basename: AMMONIA,  in the last 8760 hours CBC: No results found for this basename: WBC, NEUTROABS, HGB, HCT, MCV, PLT,  in the last 8760 hours Lipid Panel:  Recent Labs  04/09/13 0955 08/05/13 0902 12/09/13 0928  HDL 30* 32* 35*  LDLCALC 93 101* 105*  TRIG 105 119 106  CHOLHDL 4.8 4.9 4.6   TSH: No results found for this basename: TSH,  in the last 8760 hours A1C: Lab Results  Component Value Date   HGBA1C 6.4* 12/09/2013    Assessment/Plan  1. Need for prophylactic vaccination with combined diphtheria-tetanus-pertussis (DTP) vaccine - Tdap (BOOSTRIX) 5-2.5-18.5 LF-MCG/0.5 injection; Inject 0.5 mLs into the muscle once.  Dispense: 0.5 mL; Refill: 0  2. Encounter for immunization -flu shot given  3. Special screening for malignant neoplasms, colon - Ambulatory referral to Gastroenterology  4. History of smoking - CT CHEST LOW DOSE SCREENING W/O CM; Future  5. Type 2 diabetes mellitus without complication -under great control, A1c of 6.4, conts diet modification and metformin  - CBC With differential/Platelet  6. Essential hypertension, benign -Patient is stable;  continue current regimen. Will monitor and make changes as necessary.   7. BPH (benign prostatic hyperplasia) - PSA  Follow up with Raymond Stephens in January and in 6 months for EV, and as needed

## 2014-01-14 ENCOUNTER — Telehealth: Payer: Self-pay

## 2014-01-14 DIAGNOSIS — Z1211 Encounter for screening for malignant neoplasm of colon: Secondary | ICD-10-CM

## 2014-01-14 DIAGNOSIS — R972 Elevated prostate specific antigen [PSA]: Secondary | ICD-10-CM

## 2014-01-14 LAB — CBC WITH DIFFERENTIAL
BASOS: 1 %
Basophils Absolute: 0.1 10*3/uL (ref 0.0–0.2)
EOS ABS: 0.6 10*3/uL — AB (ref 0.0–0.4)
Eos: 5 %
HCT: 39.4 % (ref 37.5–51.0)
HEMOGLOBIN: 12.2 g/dL — AB (ref 12.6–17.7)
IMMATURE GRANS (ABS): 0.1 10*3/uL (ref 0.0–0.1)
Immature Granulocytes: 1 %
Lymphocytes Absolute: 3.1 10*3/uL (ref 0.7–3.1)
Lymphs: 25 %
MCH: 22.8 pg — ABNORMAL LOW (ref 26.6–33.0)
MCHC: 31 g/dL — AB (ref 31.5–35.7)
MCV: 74 fL — ABNORMAL LOW (ref 79–97)
Monocytes Absolute: 0.9 10*3/uL (ref 0.1–0.9)
Monocytes: 7 %
NEUTROS PCT: 61 %
Neutrophils Absolute: 7.6 10*3/uL — ABNORMAL HIGH (ref 1.4–7.0)
PLATELETS: 397 10*3/uL — AB (ref 150–379)
RBC: 5.36 x10E6/uL (ref 4.14–5.80)
RDW: 17.2 % — ABNORMAL HIGH (ref 12.3–15.4)
WBC: 12.4 10*3/uL — ABNORMAL HIGH (ref 3.4–10.8)

## 2014-01-14 LAB — PSA: PSA: 32.8 ng/mL — ABNORMAL HIGH (ref 0.0–4.0)

## 2014-01-14 NOTE — Telephone Encounter (Signed)
Called patient about lab results, PSA elevated 32.8, referral request done for Urologist, add on labs (iron level, ferritin, TIBC) for microcytic anemia  given to Mel to add to labs 10/25 Monday, patient to pick up hemoccult slide on Monday, order placed in encounter. Told patient we will call Urologist and call him back with date and time. Patient in agreement with this.

## 2014-01-14 NOTE — Telephone Encounter (Signed)
Message copied by Charna ElizabethWILLIAMS, Amahri Dengel J on Fri Jan 14, 2014  2:23 PM ------      Message from: KreamerEUBANKS, ArizonaJESSICA K      Created: Fri Jan 14, 2014 10:00 AM       Pt with elevated PSA, will need to be seen by a urologist, also can we add a iron level, ferritin, TIBC to blood work due to microcytic anemia and have pt come in to get a home FOBT and bring sample back to the office due to anemia ------

## 2014-01-14 NOTE — Addendum Note (Signed)
Addended by: Maurice SmallBEATTY, Chemere Steffler C on: 01/14/2014 04:21 PM   Modules accepted: Orders

## 2014-01-19 LAB — IRON AND TIBC
IRON: 64 ug/dL (ref 40–155)
Iron Saturation: 25 % (ref 15–55)
TIBC: 253 ug/dL (ref 250–450)
UIBC: 189 ug/dL (ref 150–375)

## 2014-01-19 LAB — FERRITIN: Ferritin: 376 ng/mL (ref 30–400)

## 2014-01-19 LAB — SPECIMEN STATUS REPORT

## 2014-01-20 ENCOUNTER — Other Ambulatory Visit: Payer: Self-pay | Admitting: Nurse Practitioner

## 2014-01-25 ENCOUNTER — Inpatient Hospital Stay: Admission: RE | Admit: 2014-01-25 | Payer: Medicare HMO | Source: Ambulatory Visit

## 2014-01-25 LAB — FECAL OCCULT BLOOD, IMMUNOCHEMICAL: Fecal Occult Bld: NEGATIVE

## 2014-01-27 LAB — SPECIMEN STATUS REPORT

## 2014-01-31 ENCOUNTER — Telehealth: Payer: Self-pay | Admitting: *Deleted

## 2014-01-31 ENCOUNTER — Ambulatory Visit
Admission: RE | Admit: 2014-01-31 | Discharge: 2014-01-31 | Disposition: A | Discharge status: Home / Self Care | Payer: Commercial Managed Care - HMO | Source: Ambulatory Visit | Attending: Nurse Practitioner | Admitting: Nurse Practitioner

## 2014-01-31 DIAGNOSIS — Z87891 Personal history of nicotine dependence: Secondary | ICD-10-CM

## 2014-01-31 NOTE — Telephone Encounter (Signed)
[facility] with [facility] stated that Patient does not qualify for the CT of lungs for cancer sceening because he quit smoking more than 15 years ago and Medicare will not cover, but if you want to give her a Sx she can file it through that. I checked patient's History tab and nothing there would cover. Please Advise.

## 2014-02-01 NOTE — Telephone Encounter (Signed)
Patient Notified

## 2014-02-01 NOTE — Telephone Encounter (Signed)
This was to be done for screening since he has years of smoking but if he does not qualify due to insurance please notify the patient. Thank you

## 2014-02-23 ENCOUNTER — Other Ambulatory Visit: Payer: Self-pay | Admitting: *Deleted

## 2014-02-23 ENCOUNTER — Other Ambulatory Visit: Payer: Self-pay | Admitting: Nurse Practitioner

## 2014-02-23 MED ORDER — AMLODIPINE BESYLATE 10 MG PO TABS: ORAL_TABLET | ORAL | Status: AC

## 2014-02-23 NOTE — Telephone Encounter (Signed)
[facility] 

## 2014-02-28 ENCOUNTER — Encounter: Payer: Self-pay | Admitting: Nurse Practitioner

## 2014-04-03 ENCOUNTER — Other Ambulatory Visit: Payer: Self-pay | Admitting: Nurse Practitioner

## 2014-04-07 ENCOUNTER — Other Ambulatory Visit: Payer: Commercial Managed Care - HMO

## 2014-04-07 DIAGNOSIS — E119 Type 2 diabetes mellitus without complications: Secondary | ICD-10-CM

## 2014-04-08 LAB — COMPREHENSIVE METABOLIC PANEL
ALT: 15 IU/L (ref 0–44)
AST: 12 IU/L (ref 0–40)
Albumin/Globulin Ratio: 1.4 (ref 1.1–2.5)
Albumin: 4.1 g/dL (ref 3.6–4.8)
Alkaline Phosphatase: 100 IU/L (ref 39–117)
BUN/Creatinine Ratio: 14 (ref 10–22)
BUN: 17 mg/dL (ref 8–27)
CO2: 26 mmol/L (ref 18–29)
Calcium: 10.4 mg/dL — ABNORMAL HIGH (ref 8.6–10.2)
Chloride: 101 mmol/L (ref 97–108)
Creatinine, Ser: 1.21 mg/dL (ref 0.76–1.27)
GFR calc Af Amer: 70 mL/min/{1.73_m2} (ref >59)
GFR calc non Af Amer: 61 mL/min/{1.73_m2} (ref >59)
Globulin, Total: 3 g/dL (ref 1.5–4.5)
Glucose: 84 mg/dL (ref 65–99)
Potassium: 4.3 mmol/L (ref 3.5–5.2)
Sodium: 143 mmol/L (ref 134–144)
Total Bilirubin: 0.2 mg/dL (ref 0.0–1.2)
Total Protein: 7.1 g/dL (ref 6.0–8.5)

## 2014-04-08 LAB — HEMOGLOBIN A1C
Est. average glucose Bld gHb Est-mCnc: 126 mg/dL
Hgb A1c MFr Bld: 6 % — ABNORMAL HIGH (ref 4.8–5.6)

## 2014-04-11 ENCOUNTER — Ambulatory Visit (INDEPENDENT_AMBULATORY_CARE_PROVIDER_SITE_OTHER): Payer: Commercial Managed Care - HMO | Admitting: Pharmacotherapy

## 2014-04-11 ENCOUNTER — Encounter: Payer: Self-pay | Admitting: Pharmacotherapy

## 2014-04-11 VITALS — BP 156/88 | HR 98 | Temp 98.5°F | Ht 67.0 in | Wt 193.0 lb

## 2014-04-11 DIAGNOSIS — E119 Type 2 diabetes mellitus without complications: Secondary | ICD-10-CM

## 2014-04-11 DIAGNOSIS — I10 Essential (primary) hypertension: Secondary | ICD-10-CM

## 2014-04-11 NOTE — Progress Notes (Signed)
  Subjective:    Raymond Stephens is a 69 y.o.African American male who presents for follow-up of Type 2 diabetes mellitus.   A1C down to 6.0% Average BG:  142m/dl No hypoglycemia Lowest BG:  868mdl Continues metformin.  Has had eye exam 2-3 months ago.  No problems with vision. Denies problems with feet. Denies peripheral edema. Nocturia 2-3 times per night.  Walking for exercise. Making healthy food choices.  Says he is not skipping meals.  Has cut portion sizes.  [place]  Review of Systems A comprehensive review of systems was negative except for: Genitourinary: positive for nocturia    Objective:    BP 156/88 mmHg  Pulse 98  Temp(Src) 98.5 F (36.9 C) (Oral)  Ht 5' 7"$  (1.702 m)  Wt 193 lb (87.544 kg)  BMI 30.22 kg/m2  SpO2 97%  General:  alert, cooperative and no distress  Oropharynx: normal findings: lips normal without lesions and gums healthy   Eyes:  negative findings: lids and lashes normal and conjunctivae and sclerae normal   Ears:  external ears normal        Lung: clear to auscultation bilaterally  Heart:  regular rate and rhythm     Extremities: no edema  Skin: warm and dry, no hyperpigmentation, vitiligo, or suspicious lesions     Neuro: mental status, speech normal, alert and oriented x3 and gait and station normal   Lab Review GLUCOSE (mg/dL)  Date Value  04/07/2014 84  12/09/2013 77  08/05/2013 85   CO2 (mmol/L)  Date Value  04/07/2014 26  12/09/2013 27  08/05/2013 27   BUN (mg/dL)  Date Value  04/07/2014 17  12/09/2013 21  08/05/2013 21   CREATININE, SER (mg/dL)  Date Value  04/07/2014 1.21  12/09/2013 1.35*  08/05/2013 1.37*     A1C 6.0%  Assessment:    Diabetes Mellitus type II, under excellent control.   A1C at goal <7% BP above goal <140/90   Plan:    1.  Rx changes: none  2.  Continue Metformin. 3.  Reviewed nutrition goals. 4.  Exercise goal is 30-45 minutes 5 x week. 5.   BP too high this morning.  He has not taken his antihypertensive yet.

## 2014-04-11 NOTE — Addendum Note (Signed)
Addended by: Marvia PicklesBANKS, Keiarah Orlowski on: 04/11/2014 10:06 AM   Modules accepted: Orders

## 2014-04-12 LAB — MICROALBUMIN / CREATININE URINE RATIO
Creatinine, Ur: 140 mg/dL (ref 22.0–328.0)
MICROALB/CREAT RATIO: 4.7 mg/g creat (ref 0.0–30.0)
Microalbumin, Urine: 6.6 ug/mL (ref 0.0–17.0)

## 2014-05-05 ENCOUNTER — Other Ambulatory Visit: Payer: Self-pay | Admitting: Nurse Practitioner

## 2014-05-18 ENCOUNTER — Other Ambulatory Visit: Payer: Self-pay | Admitting: *Deleted

## 2014-05-18 DIAGNOSIS — E1110 Type 2 diabetes mellitus with ketoacidosis without coma: Secondary | ICD-10-CM

## 2014-05-18 MED ORDER — METFORMIN HCL 500 MG PO TABS: 500.0000 mg | ORAL_TABLET | Freq: Two times a day (BID) | ORAL | Status: AC

## 2014-06-09 ENCOUNTER — Other Ambulatory Visit: Payer: Self-pay | Admitting: Internal Medicine

## 2014-07-07 ENCOUNTER — Other Ambulatory Visit: Payer: Commercial Managed Care - HMO

## 2014-07-11 ENCOUNTER — Encounter: Payer: Self-pay | Admitting: Nurse Practitioner

## 2014-07-11 ENCOUNTER — Ambulatory Visit: Payer: Commercial Managed Care - HMO | Admitting: Pharmacotherapy

## 2014-07-11 DIAGNOSIS — Z0289 Encounter for other administrative examinations: Secondary | ICD-10-CM

## 2014-07-14 ENCOUNTER — Encounter: Payer: Commercial Managed Care - HMO | Admitting: Nurse Practitioner

## 2014-07-19 IMAGING — CR DG CHEST 2V
2 series · 2 of 2 positions shown · non-contrast
Comparison: None.

CLINICAL DATA: Cough

CHEST - 2 VIEW

[w chest pa]
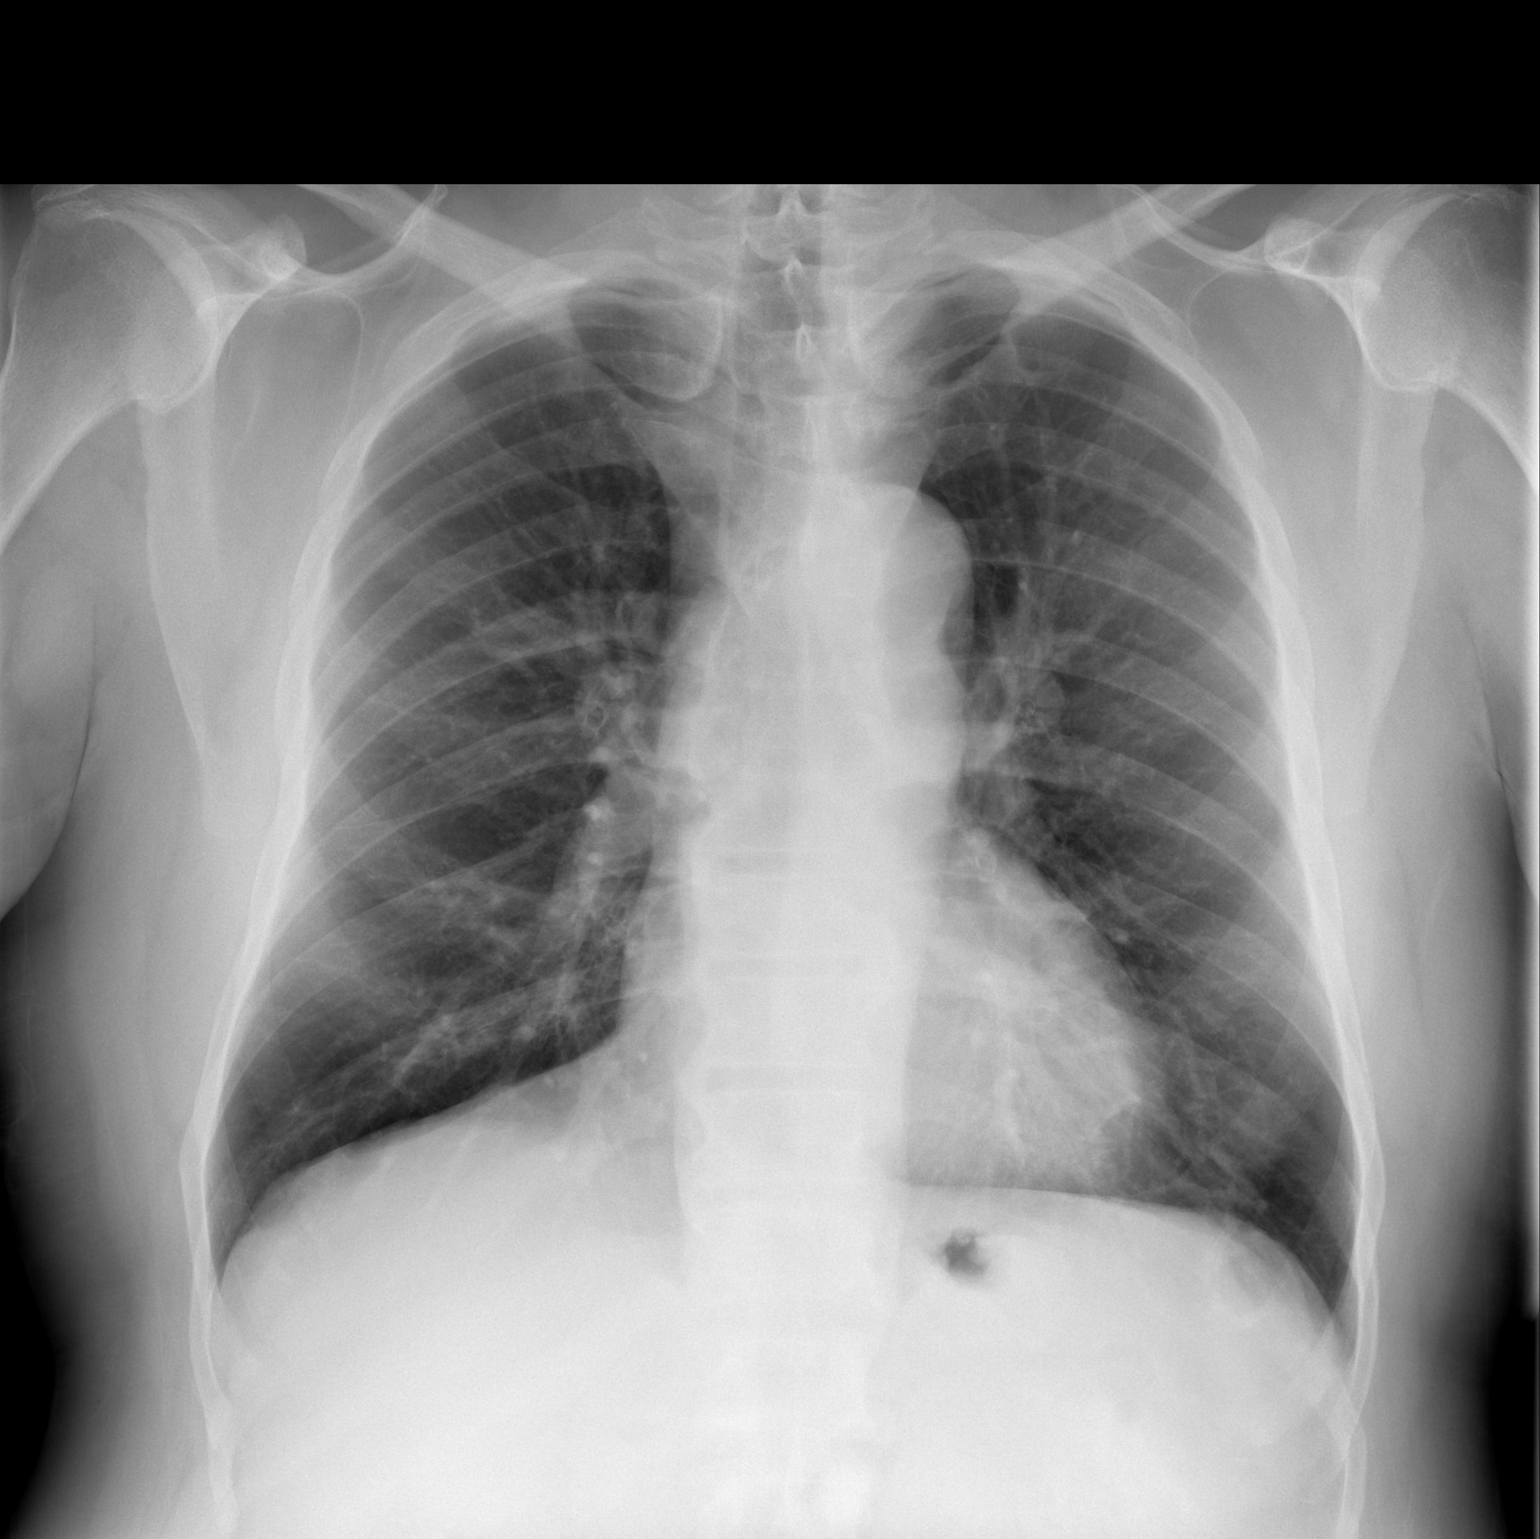

[w chest lat]
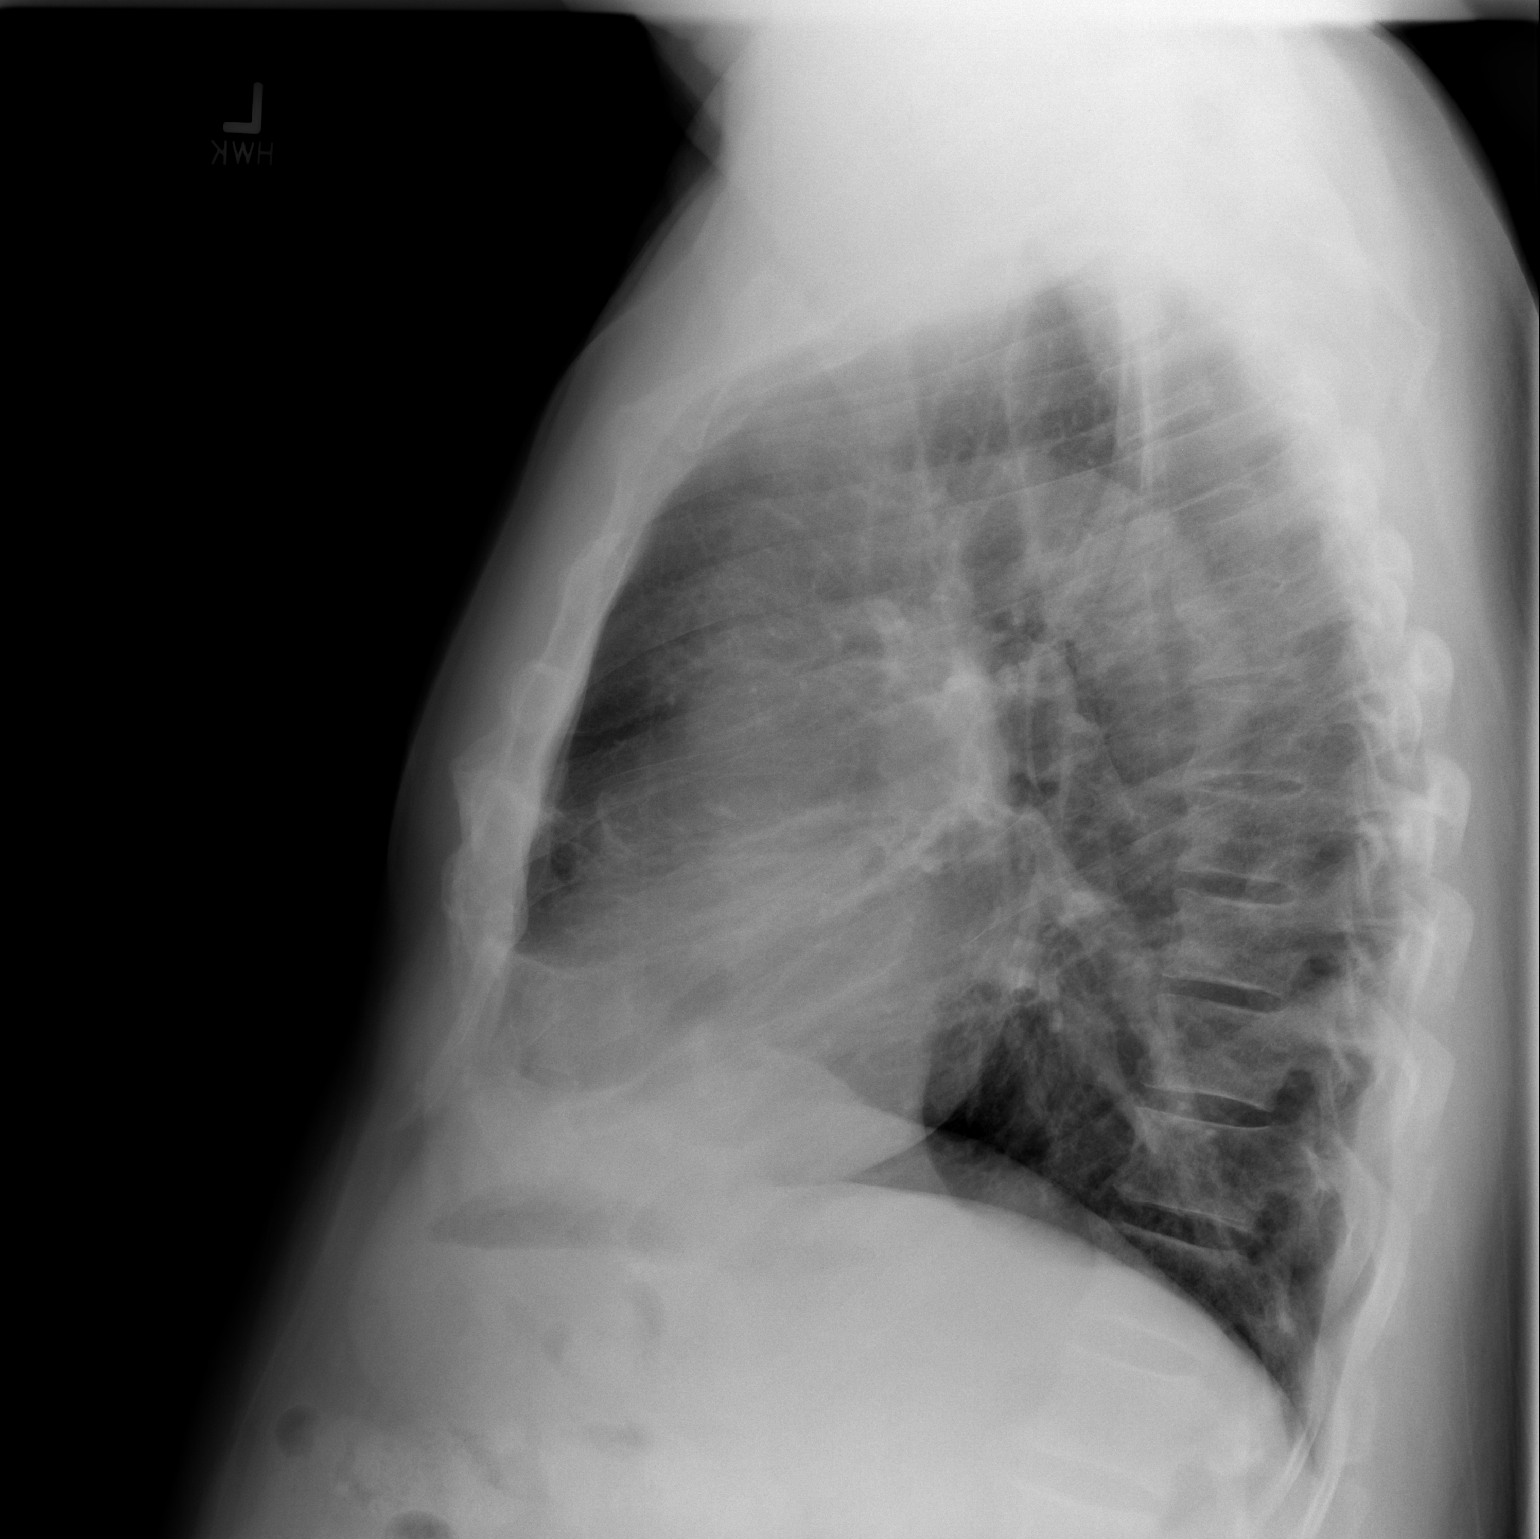

[2 of 2 positions shown; findings below may reference images not displayed]

FINDINGS: Cardiomediastinal silhouette appears normal.  No acute
pulmonary disease is noted.  Bony thorax is intact.
IMPRESSION: No acute cardiopulmonary abnormality seen.

## 2014-11-05 ENCOUNTER — Other Ambulatory Visit: Payer: Self-pay | Admitting: Nurse Practitioner

## 2014-12-06 ENCOUNTER — Other Ambulatory Visit: Payer: Self-pay | Admitting: Nurse Practitioner

## 2015-01-07 ENCOUNTER — Other Ambulatory Visit: Payer: Self-pay | Admitting: Nurse Practitioner

## 2015-01-20 ENCOUNTER — Telehealth: Payer: Self-pay

## 2015-01-20 NOTE — Telephone Encounter (Signed)
-----   Message from Sharon SellerJessica K Eubanks, NP sent at 01/20/2015  9:03 AM EDT ----- Has pt changed providers? Missed last OV, needs follow up ----- Message -----    From: SYSTEM    Sent: 01/20/2015  12:04 AM      To: Sharon SellerJessica K Eubanks, NP

## 2015-01-20 NOTE — Telephone Encounter (Signed)
Called patient, patient's number is invalid. Letter mailed
